# Patient Record
Sex: Male | Born: 1995 | Hispanic: Yes | Marital: Married | Smoking: Current every day smoker
Health system: Southern US, Community
[De-identification: ages and names within clinical notes are randomized; demographics above are authoritative.]

## PROBLEM LIST (undated history)

## (undated) DIAGNOSIS — Z789 Other specified health status: Secondary | ICD-10-CM

## (undated) DIAGNOSIS — F32A Depression, unspecified: Secondary | ICD-10-CM

## (undated) DIAGNOSIS — R519 Headache, unspecified: Secondary | ICD-10-CM

## (undated) DIAGNOSIS — K649 Unspecified hemorrhoids: Secondary | ICD-10-CM

## (undated) HISTORY — PX: OTHER SURGICAL HISTORY: SHX169

## (undated) HISTORY — PX: NO PAST SURGERIES: SHX2092

## (undated) HISTORY — DX: Other specified health status: Z78.9

---

## 2016-05-16 ENCOUNTER — Emergency Department (HOSPITAL_COMMUNITY)
Admission: EM | Admit: 2016-05-16 | Discharge: 2016-05-16 | Disposition: A | Discharge status: Home / Self Care | Payer: Self-pay | Attending: Emergency Medicine | Admitting: Emergency Medicine

## 2016-05-16 ENCOUNTER — Emergency Department (HOSPITAL_COMMUNITY): Payer: Self-pay

## 2016-05-16 DIAGNOSIS — X500XXA Overexertion from strenuous movement or load, initial encounter: Secondary | ICD-10-CM | POA: Insufficient documentation

## 2016-05-16 DIAGNOSIS — S29012A Strain of muscle and tendon of back wall of thorax, initial encounter: Secondary | ICD-10-CM | POA: Insufficient documentation

## 2016-05-16 DIAGNOSIS — T148XXA Other injury of unspecified body region, initial encounter: Secondary | ICD-10-CM

## 2016-05-16 DIAGNOSIS — Y999 Unspecified external cause status: Secondary | ICD-10-CM | POA: Insufficient documentation

## 2016-05-16 DIAGNOSIS — M5412 Radiculopathy, cervical region: Secondary | ICD-10-CM | POA: Insufficient documentation

## 2016-05-16 DIAGNOSIS — Y929 Unspecified place or not applicable: Secondary | ICD-10-CM | POA: Insufficient documentation

## 2016-05-16 DIAGNOSIS — R0789 Other chest pain: Secondary | ICD-10-CM | POA: Insufficient documentation

## 2016-05-16 DIAGNOSIS — Y939 Activity, unspecified: Secondary | ICD-10-CM | POA: Insufficient documentation

## 2016-05-16 MED ORDER — NAPROXEN 500 MG PO TABS: 500.0000 mg | ORAL_TABLET | Freq: Two times a day (BID) | ORAL | 0 refills | Status: DC

## 2016-05-16 MED ORDER — CYCLOBENZAPRINE HCL 10 MG PO TABS: 10.0000 mg | ORAL_TABLET | Freq: Two times a day (BID) | ORAL | 0 refills | Status: DC | PRN

## 2016-05-16 NOTE — ED Provider Notes (Signed)
[facility]-EMERGENCY DEPT Provider Note   CSN: [id number] Arrival date & time: 05/16/16  1191     History   Chief Complaint Chief Complaint  Patient presents with  . Chest Pain    HPI Brad [name] is a 21 y.o. male.  HPI Symptoms onset 2 days ago. Patient has pain along the top of his shoulder, upper chest in the area of the clavicle and left arm. He reports with certain activities, particularly leaning towards the left and lifting with the arm, he gets pins and needles sensation in the left arm. No loss of strength. He does however note the more he forcefully tries to do something the more he perceives the tingling sensation. No headache. No injury. No shortness of breath, no fever. He reports at work he typically lives about 30 pounds but not much heavier than that. He denies any recent illness. No past medical history on file. Patient is healthy without known past medical history. There are no active problems to display for this patient.   No past surgical history on file.     Home Medications    Prior to Admission medications   Medication Sig Start Date End Date Taking? Authorizing Provider  cyclobenzaprine (FLEXERIL) 10 MG tablet Take 1 tablet (10 mg total) by mouth 2 (two) times daily as needed for muscle spasms. 05/16/16   Arby Barrette, MD  naproxen (NAPROSYN) 500 MG tablet Take 1 tablet (500 mg total) by mouth 2 (two) times daily. 05/16/16   Arby Barrette, MD    Family History No family history on file. Mother has diabetes. Family members otherwise healthy. No sudden death or early coronary artery disease. Social History Social History  Substance Use Topics  . Smoking status: Not on file  . Smokeless tobacco: Not on file  . Alcohol use Not on file     Allergies   Patient has no known allergies.   Review of Systems Review of Systems 10 Systems reviewed and are negative for acute change except as noted in the HPI.   Physical Exam Updated Vital  Signs BP 132/81   Pulse 80   Temp 98.4 F (36.9 C) (Oral)   Resp 19   SpO2 100%   Physical Exam  Constitutional: He is oriented to person, place, and time. He appears well-developed and well-nourished.  Awake, alert, nontoxic appearance with baseline speech for patient.  HENT:  Head: Normocephalic and atraumatic.  Nose: Nose normal.  Mouth/Throat: Oropharynx is clear and moist. No oropharyngeal exudate.  Eyes: EOM are normal. Pupils are equal, round, and reactive to light. Right eye exhibits no discharge. Left eye exhibits no discharge.  Neck: Neck supple.  Pain to palpation left paraspinous cervical muscle bodies and trapezius along top of shoulder very tender.  Cardiovascular: Normal rate, regular rhythm, normal heart sounds and intact distal pulses.   No murmur heard. Pulmonary/Chest: Effort normal and breath sounds normal. No stridor. No respiratory distress. He has no wheezes. He has no rales. He exhibits tenderness.  Tender to palpation upper chest along the distal clavicle and anterior shoulder.  Abdominal: Soft. Bowel sounds are normal. He exhibits no mass. There is no tenderness. There is no rebound.  Musculoskeletal: Normal range of motion. He exhibits no edema or tenderness.  Baseline ROM, moves extremities with no obvious new focal weakness.  Lymphadenopathy:    He has no cervical adenopathy.  Neurological: He is alert and oriented to person, place, and time. No cranial nerve deficit. He exhibits normal muscle  tone. Coordination normal.  Awake, alert, cooperative and aware of situation; motor strength bilaterally; sensation normal to light touch bilaterally; gait is normal..  Skin: Skin is warm and dry. No rash noted.  Psychiatric: He has a normal mood and affect.  Nursing note and vitals reviewed.    ED Treatments / Results  Labs (all labs ordered are listed, but only abnormal results are displayed) Labs Reviewed - No data to display  EKG  EKG  Interpretation  Date/Time:  Monday May 16 2016 08:48:25 EDT Ventricular Rate:  71 PR Interval:  130 QRS Duration: 86 QT Interval:  338 QTC Calculation: 367 R Axis:   87 Text Interpretation:  Normal sinus rhythm early repolarization. normal. no old comparison. Confirmed by Donnald Garre, MD, Lebron Conners [phone]) on 05/16/2016 10:36:47 AM       Radiology Dg Chest 2 View  Result Date: 05/16/2016 CLINICAL DATA:  21 year old male with chest pain the last few days. Smoker. Initial encounter. EXAM: CHEST  2 VIEW COMPARISON:  None. FINDINGS: The heart size and mediastinal contours are within normal limits. Both lungs are clear. The visualized skeletal structures are unremarkable. IMPRESSION: No active cardiopulmonary disease. Electronically Signed   By: Lacy Duverney M.D.   On: 05/16/2016 09:06    Procedures Procedures (including critical care time)  Medications Ordered in ED Medications - No data to display   Initial Impression / Assessment and Plan / ED Course  I have reviewed the triage vital signs and the nursing notes.  Pertinent labs & imaging results that were available during my care of the patient were reviewed by me and considered in my medical decision making (see chart for details).      Final Clinical Impressions(s) / ED Diagnoses   Final diagnoses:  Radiculopathy of cervical region  Muscle strain  Chest wall pain   Symptoms are suggestive of radicular pain to the arm. No associated weakness. Positive for associated reproducible paracervical and trapezius pain to palpation. Patient with no risk factors for coronary artery disease, dissection, stroke, PE. He is clinically well and physically well condition with normal vital signs. Will treat with anti-inflammatory and muscle relaxer. New Prescriptions New Prescriptions   CYCLOBENZAPRINE (FLEXERIL) 10 MG TABLET    Take 1 tablet (10 mg total) by mouth 2 (two) times daily as needed for muscle spasms.   NAPROXEN (NAPROSYN) 500 MG  TABLET    Take 1 tablet (500 mg total) by mouth 2 (two) times daily.     Arby Barrette, MD 05/16/16 1040

## 2016-05-16 NOTE — ED Triage Notes (Signed)
Pt arrives ambulatory to ED for left sided chest pain since saturday. Pt states he has pain in his left chest and left arm and also has numbness in left arm.

## 2016-05-16 NOTE — ED Notes (Signed)
Pt given water to drink per Kirt Boys, RN

## 2016-05-19 ENCOUNTER — Emergency Department (HOSPITAL_COMMUNITY)
Admission: EM | Admit: 2016-05-19 | Discharge: 2016-05-19 | Disposition: A | Discharge status: Home / Self Care | Payer: Self-pay

## 2017-02-24 ENCOUNTER — Encounter (HOSPITAL_COMMUNITY): Payer: Self-pay | Admitting: Emergency Medicine

## 2017-02-24 ENCOUNTER — Other Ambulatory Visit: Payer: Self-pay

## 2017-02-24 DIAGNOSIS — H538 Other visual disturbances: Secondary | ICD-10-CM | POA: Insufficient documentation

## 2017-02-24 DIAGNOSIS — R51 Headache: Secondary | ICD-10-CM | POA: Insufficient documentation

## 2017-02-24 DIAGNOSIS — Z79899 Other long term (current) drug therapy: Secondary | ICD-10-CM | POA: Insufficient documentation

## 2017-02-24 DIAGNOSIS — F17210 Nicotine dependence, cigarettes, uncomplicated: Secondary | ICD-10-CM | POA: Insufficient documentation

## 2017-02-24 NOTE — ED Triage Notes (Signed)
Pt reports headache and pain to back of neck that he first had 3 days ago while having intercourse.  Pt had pain again tonight during intercourse.  Denies nausea and vomiting.  Dr. Patria Maneampos notified of pt and orders received.

## 2017-02-25 ENCOUNTER — Emergency Department (HOSPITAL_COMMUNITY): Payer: Self-pay

## 2017-02-25 ENCOUNTER — Emergency Department (HOSPITAL_COMMUNITY)
Admission: EM | Admit: 2017-02-25 | Discharge: 2017-02-25 | Disposition: A | Discharge status: Home / Self Care | Payer: Self-pay | Attending: Emergency Medicine | Admitting: Emergency Medicine

## 2017-02-25 DIAGNOSIS — R519 Headache, unspecified: Secondary | ICD-10-CM

## 2017-02-25 DIAGNOSIS — R51 Headache: Secondary | ICD-10-CM

## 2017-02-25 LAB — COMPREHENSIVE METABOLIC PANEL
ALT: 41 U/L (ref 17–63)
ANION GAP: 10 (ref 5–15)
AST: 24 U/L (ref 15–41)
Albumin: 4.1 g/dL (ref 3.5–5.0)
Alkaline Phosphatase: 72 U/L (ref 38–126)
BUN: 14 mg/dL (ref 6–20)
CHLORIDE: 104 mmol/L (ref 101–111)
CO2: 26 mmol/L (ref 22–32)
Calcium: 9.4 mg/dL (ref 8.9–10.3)
Creatinine, Ser: 0.8 mg/dL (ref 0.61–1.24)
GFR calc non Af Amer: >60 mL/min (ref >60)
Glucose, Bld: 107 mg/dL — ABNORMAL HIGH (ref 65–99)
Potassium: 4.2 mmol/L (ref 3.5–5.1)
SODIUM: 140 mmol/L (ref 135–145)
Total Bilirubin: 0.6 mg/dL (ref 0.3–1.2)
Total Protein: 7.3 g/dL (ref 6.5–8.1)

## 2017-02-25 LAB — CBC WITH DIFFERENTIAL/PLATELET
BASOS PCT: 1 %
Basophils Absolute: 0.1 10*3/uL (ref 0.0–0.1)
EOS ABS: 0.1 10*3/uL (ref 0.0–0.7)
EOS PCT: 2 %
HCT: 45.9 % (ref 39.0–52.0)
Hemoglobin: 15.6 g/dL (ref 13.0–17.0)
LYMPHS ABS: 2.7 10*3/uL (ref 0.7–4.0)
Lymphocytes Relative: 34 %
MCH: 31.1 pg (ref 26.0–34.0)
MCHC: 34 g/dL (ref 30.0–36.0)
MCV: 91.6 fL (ref 78.0–100.0)
MONOS PCT: 9 %
Monocytes Absolute: 0.7 10*3/uL (ref 0.1–1.0)
Neutro Abs: 4.3 10*3/uL (ref 1.7–7.7)
Neutrophils Relative %: 54 %
PLATELETS: 294 10*3/uL (ref 150–400)
RBC: 5.01 MIL/uL (ref 4.22–5.81)
RDW: 12.6 % (ref 11.5–15.5)
WBC: 7.9 10*3/uL (ref 4.0–10.5)

## 2017-02-25 MED ORDER — METOCLOPRAMIDE HCL 10 MG PO TABS: 10.0000 mg | ORAL_TABLET | Freq: Four times a day (QID) | ORAL | 0 refills | Status: DC | PRN

## 2017-02-25 MED ORDER — DIPHENHYDRAMINE HCL 50 MG/ML IJ SOLN
25.0000 mg | Freq: Once | INTRAMUSCULAR | Status: AC
  Administered 2017-02-25: 25 mg via INTRAVENOUS
  Filled 2017-02-25: qty 1

## 2017-02-25 MED ORDER — KETOROLAC TROMETHAMINE 30 MG/ML IJ SOLN
30.0000 mg | Freq: Once | INTRAMUSCULAR | Status: AC
  Administered 2017-02-25: 30 mg via INTRAVENOUS
  Filled 2017-02-25: qty 1

## 2017-02-25 MED ORDER — SODIUM CHLORIDE 0.9 % IV BOLUS (SEPSIS)
1000.0000 mL | Freq: Once | INTRAVENOUS | Status: AC
  Administered 2017-02-25: 1000 mL via INTRAVENOUS

## 2017-02-25 MED ORDER — PROCHLORPERAZINE EDISYLATE 5 MG/ML IJ SOLN
10.0000 mg | Freq: Once | INTRAMUSCULAR | Status: AC
  Administered 2017-02-25: 10 mg via INTRAVENOUS
  Filled 2017-02-25: qty 2

## 2017-02-25 MED ORDER — DEXAMETHASONE SODIUM PHOSPHATE 10 MG/ML IJ SOLN
10.0000 mg | Freq: Once | INTRAMUSCULAR | Status: AC
  Administered 2017-02-25: 10 mg via INTRAVENOUS
  Filled 2017-02-25: qty 1

## 2017-02-25 MED ORDER — BUTALBITAL-APAP-CAFFEINE 50-325-40 MG PO TABS: 1.0000 | ORAL_TABLET | Freq: Four times a day (QID) | ORAL | 0 refills | Status: DC | PRN

## 2017-02-25 NOTE — Discharge Instructions (Signed)
Do not hesitate to return to the emergency room for any new, worsening or concerning symptoms. ° °Please obtain primary care using resource guide below. Let them know that you were seen in the emergency room and that they will need to obtain records for further outpatient management. ° ° °

## 2017-02-25 NOTE — ED Notes (Signed)
ED Provider at bedside. 

## 2017-02-25 NOTE — ED Provider Notes (Signed)
[name] Provider Note   CSN: [id number] Arrival date & time: 02/24/17  2310     History   Chief Complaint Chief Complaint  Patient presents with  . Headache     HPI   Blood pressure 120/77, pulse 70, temperature 98 F (36.7 C), resp. rate 16, height 5\' 5"  (1.651 m), weight 74.8 kg (165 lb), SpO2 99 %.  Brad [name] is a 22 y.o. male complaining of basilar headache onset 3 days ago during intercourse.  He states that the pain was so severe they had to stop having sex.  He has been having it off and on over the course of the last several days and then he started having an 10 PM and it was again very severe, again he had to stop.  He is tried multiple over-the-counter pain medications.  He is light sensitive with no or, nausea or vomiting.  He rates his pain at 8 out of 10.  No neck stiffness, fever chills, sore throat, ataxia, chest pain, shortness of breath.  He endorses blurred vision on review of systems.  Reports that he has had bone pain since he was 22 years old.  He denies any easy bruising, bleeding, unintentional weight loss but he endorses night sweats.  History reviewed. No pertinent past medical history.  There are no active problems to display for this patient.   History reviewed. No pertinent surgical history.     Home Medications    Prior to Admission medications   Medication Sig Start Date End Date Taking? Authorizing Provider  butalbital-acetaminophen-caffeine (FIORICET, ESGIC) [phone] MG tablet Take 1 tablet by mouth every 6 (six) hours as needed for headache. 02/25/17   Lyric Rossano, Joni ReiningNicole, PA-C  cyclobenzaprine (FLEXERIL) 10 MG tablet Take 1 tablet (10 mg total) by mouth 2 (two) times daily as needed for muscle spasms. Patient not taking: Reported on 02/25/2017 05/16/16   Arby BarrettePfeiffer, Marcy, MD  metoCLOPramide (REGLAN) 10 MG tablet Take 1 tablet (10 mg total) by mouth every 6 (six) hours as needed for nausea  (nausea/headache). 02/25/17   Brandin Stetzer, Joni ReiningNicole, PA-C  naproxen (NAPROSYN) 500 MG tablet Take 1 tablet (500 mg total) by mouth 2 (two) times daily. Patient not taking: Reported on 02/25/2017 05/16/16   Arby BarrettePfeiffer, Marcy, MD    Family History No family history on file.  Social History Social History   Tobacco Use  . Smoking status: Current Every Day Smoker  . Smokeless tobacco: Never Used  Substance Use Topics  . Alcohol use: Yes  . Drug use: Yes    Types: Marijuana     Allergies   Patient has no known allergies.   Review of Systems Review of Systems  A complete review of systems was obtained and all systems are negative except as noted in the HPI and PMH.   Physical Exam Updated Vital Signs BP 112/78   Pulse 93   Temp 98 F (36.7 C)   Resp 16   Ht 5\' 5"  (1.651 m)   Wt 74.8 kg (165 lb)   SpO2 100%   BMI 27.46 kg/m   Physical Exam  Constitutional: He is oriented to person, place, and time. He appears well-developed and well-nourished. No distress.  HENT:  Head: Normocephalic and atraumatic.  Mouth/Throat: Oropharynx is clear and moist.  Eyes: Conjunctivae and EOM are normal. Pupils are equal, round, and reactive to light.  No TTP of maxillary or frontal sinuses  No TTP or induration of temporal arteries bilaterally  Neck: Normal range of motion. Neck supple.  FROM to C-spine. Pt can touch chin to chest without discomfort. No TTP of midline cervical spine.   Cardiovascular: Normal rate, regular rhythm and intact distal pulses.  Pulmonary/Chest: Effort normal and breath sounds normal. No respiratory distress. He has no wheezes. He has no rales. He exhibits no tenderness.  Abdominal: Soft. Bowel sounds are normal. There is no tenderness.  Musculoskeletal: Normal range of motion. He exhibits no edema or tenderness.  Neurological: He is alert and oriented to person, place, and time. No cranial nerve deficit. He displays no Babinski's sign (.npmdm) on the left side.    II-Visual fields grossly intact. III/IV/VI-Extraocular movements intact.  Pupils reactive bilaterally. V/VII-Smile symmetric, equal eyebrow raise,  facial sensation intact VIII- Hearing grossly intact IX/X-Normal gag XI-bilateral shoulder shrug XII-midline tongue extension Motor: 5/5 bilaterally with normal tone and bulk Cerebellar: Normal finger-to-nose  and normal heel-to-shin test.   Romberg negative Ambulates with a coordinated gait   Skin: He is not diaphoretic.  Psychiatric: He has a normal mood and affect.  Nursing note and vitals reviewed.    ED Treatments / Results  Labs (all labs ordered are listed, but only abnormal results are displayed) Labs Reviewed  COMPREHENSIVE METABOLIC PANEL - Abnormal; Notable for the following components:      Result Value   Glucose, Bld 107 (*)    All other components within normal limits  CBC WITH DIFFERENTIAL/PLATELET    EKG  EKG Interpretation None       Radiology Ct Head Wo Contrast  Result Date: 02/25/2017 CLINICAL DATA:  Headache and pain to the back of the head beginning 3 days ago while having intercourse. Pain again tonight. Bump on the right side of the forehead. EXAM: CT HEAD WITHOUT CONTRAST TECHNIQUE: Contiguous axial images were obtained from the base of the skull through the vertex without intravenous contrast. COMPARISON:  None. FINDINGS: Brain: No evidence of acute infarction, hemorrhage, hydrocephalus, extra-axial collection or mass lesion/mass effect. Vascular: No hyperdense vessel or unexpected calcification. Skull: Normal. Negative for fracture or focal lesion. Sinuses/Orbits: Diffuse mucosal thickening throughout the paranasal sinuses. No acute air-fluid levels. Mastoid air cells are not opacified. Other: None. IMPRESSION: No acute intracranial abnormalities. Chronic inflammatory changes in the paranasal sinuses. Electronically Signed   By: Burman Nieves M.D.   On: 02/25/2017 00:51    Procedures Procedures  (including critical care time)  Medications Ordered in ED Medications  sodium chloride 0.9 % bolus 1,000 mL (0 mLs Intravenous Stopped 02/25/17 0759)  ketorolac (TORADOL) 30 MG/ML injection 30 mg (30 mg Intravenous Given 02/25/17 0715)  prochlorperazine (COMPAZINE) injection 10 mg (10 mg Intravenous Given 02/25/17 0717)  dexamethasone (DECADRON) injection 10 mg (10 mg Intravenous Given 02/25/17 0718)  diphenhydrAMINE (BENADRYL) injection 25 mg (25 mg Intravenous Given 02/25/17 0719)     Initial Impression / Assessment and Plan / ED Course  I have reviewed the triage vital signs and the nursing notes.  Pertinent labs & imaging results that were available during my care of the patient were reviewed by me and considered in my medical decision making (see chart for details).     Vitals:   02/24/17 2338 02/25/17 0231 02/25/17 0700 02/25/17 0730  BP: 137/63 120/77 118/72 112/78  Pulse: 95 70 78 93  Resp: 18 16    Temp: 98 F (36.7 C)     SpO2: 100% 99% 100% 100%  Weight: 74.8 kg (165 lb)     Height: 5\' 5"  (  1.651 m)       Medications  sodium chloride 0.9 % bolus 1,000 mL (0 mLs Intravenous Stopped 02/25/17 0759)  ketorolac (TORADOL) 30 MG/ML injection 30 mg (30 mg Intravenous Given 02/25/17 0715)  prochlorperazine (COMPAZINE) injection 10 mg (10 mg Intravenous Given 02/25/17 0717)  dexamethasone (DECADRON) injection 10 mg (10 mg Intravenous Given 02/25/17 0718)  diphenhydrAMINE (BENADRYL) injection 25 mg (25 mg Intravenous Given 02/25/17 0719)    Wally Shevchenko Marques is 22 y.o. male headache exacerbation during intercourse.  He has a nonfocal neurologic exam, head CT negative.  Discussed with attending physician who does not recommend any further emergent intervention at this point, patient given headache cocktail with complete resolution of his symptoms, he will be given a referral to outpatient primary care, offered work note however patient declines he states that he works in  [employer] and they would not on her a work note regardless.  Return precautions and patient verbalized understanding and teach back technique.  Evaluation does not show pathology that would require ongoing emergent intervention or inpatient treatment. Pt is hemodynamically stable and mentating appropriately. Discussed findings and plan with patient/guardian, who agrees with care plan. All questions answered. Return precautions discussed and outpatient follow up given.      Final Clinical Impressions(s) / ED Diagnoses   Final diagnoses:  Nonintractable headache, unspecified chronicity pattern, unspecified headache type    ED Discharge Orders        Ordered    butalbital-acetaminophen-caffeine (FIORICET, ESGIC) 50-325-40 MG tablet  Every 6 hours PRN     02/25/17 0828    metoCLOPramide (REGLAN) 10 MG tablet  Every 6 hours PRN     02/25/17 0828       Zuley Lutter, Mardella Layman 02/25/17 1610    Azalia Bilis, MD 02/26/17 [phone]

## 2017-06-30 ENCOUNTER — Encounter (HOSPITAL_COMMUNITY): Payer: Self-pay

## 2017-06-30 ENCOUNTER — Other Ambulatory Visit: Payer: Self-pay

## 2017-06-30 ENCOUNTER — Emergency Department (HOSPITAL_COMMUNITY)
Admission: EM | Admit: 2017-06-30 | Discharge: 2017-06-30 | Disposition: A | Discharge status: Home / Self Care | Payer: Self-pay | Attending: Emergency Medicine | Admitting: Emergency Medicine

## 2017-06-30 DIAGNOSIS — K436 Other and unspecified ventral hernia with obstruction, without gangrene: Secondary | ICD-10-CM | POA: Insufficient documentation

## 2017-06-30 DIAGNOSIS — K439 Ventral hernia without obstruction or gangrene: Secondary | ICD-10-CM

## 2017-06-30 DIAGNOSIS — K5900 Constipation, unspecified: Secondary | ICD-10-CM | POA: Insufficient documentation

## 2017-06-30 DIAGNOSIS — F17210 Nicotine dependence, cigarettes, uncomplicated: Secondary | ICD-10-CM | POA: Insufficient documentation

## 2017-06-30 LAB — URINALYSIS, ROUTINE W REFLEX MICROSCOPIC
BILIRUBIN URINE: NEGATIVE
Glucose, UA: NEGATIVE mg/dL
Hgb urine dipstick: NEGATIVE
KETONES UR: NEGATIVE mg/dL
Leukocytes, UA: NEGATIVE
NITRITE: NEGATIVE
PROTEIN: NEGATIVE mg/dL
Specific Gravity, Urine: 1.004 — ABNORMAL LOW (ref 1.005–1.030)
pH: 6 (ref 5.0–8.0)

## 2017-06-30 LAB — CBC
HEMATOCRIT: 46.9 % (ref 39.0–52.0)
Hemoglobin: 16.3 g/dL (ref 13.0–17.0)
MCH: 31.7 pg (ref 26.0–34.0)
MCHC: 34.8 g/dL (ref 30.0–36.0)
MCV: 91.2 fL (ref 78.0–100.0)
PLATELETS: 233 10*3/uL (ref 150–400)
RBC: 5.14 MIL/uL (ref 4.22–5.81)
RDW: 12.6 % (ref 11.5–15.5)
WBC: 7.7 10*3/uL (ref 4.0–10.5)

## 2017-06-30 LAB — COMPREHENSIVE METABOLIC PANEL
ALBUMIN: 5.2 g/dL — AB (ref 3.5–5.0)
ALK PHOS: 70 U/L (ref 38–126)
ALT: 28 U/L (ref 17–63)
AST: 19 U/L (ref 15–41)
Anion gap: 9 (ref 5–15)
BUN: 12 mg/dL (ref 6–20)
CALCIUM: 9.4 mg/dL (ref 8.9–10.3)
CO2: 26 mmol/L (ref 22–32)
CREATININE: 0.81 mg/dL (ref 0.61–1.24)
Chloride: 106 mmol/L (ref 101–111)
GFR calc Af Amer: >60 mL/min (ref >60)
GLUCOSE: 94 mg/dL (ref 65–99)
POTASSIUM: 4 mmol/L (ref 3.5–5.1)
Sodium: 141 mmol/L (ref 135–145)
TOTAL PROTEIN: 7.9 g/dL (ref 6.5–8.1)
Total Bilirubin: 0.6 mg/dL (ref 0.3–1.2)

## 2017-06-30 LAB — LIPASE, BLOOD: Lipase: 33 U/L (ref 11–51)

## 2017-06-30 MED ORDER — ONDANSETRON 4 MG PO TBDP: 4.0000 mg | ORAL_TABLET | Freq: Three times a day (TID) | ORAL | 0 refills | Status: DC | PRN

## 2017-06-30 MED ORDER — DOCUSATE SODIUM 100 MG PO CAPS: 100.0000 mg | ORAL_CAPSULE | Freq: Two times a day (BID) | ORAL | 0 refills | Status: DC

## 2017-06-30 NOTE — Discharge Instructions (Addendum)
Colace medication to help prevent constipation and straining. Call Dr. Luisa Hart (Surgeon) to make outpatient appointment to discuss hernia repair

## 2017-06-30 NOTE — ED Triage Notes (Addendum)
Patient reports that he has issues with constipation and abdominal pain x 1 month. Patient states he had a normal BM today,but other days he has to take stool sofetners and states it doesn't really work. Patient also reports that he has blurred vision and dizziness when he wakes int he AM. Patient c/o raised area to the let upper abdomen x 2 months and when he walks and touches the area it is like 6/10 pain

## 2017-06-30 NOTE — ED Provider Notes (Signed)
[facility]-EMERGENCY DEPT Provider Note   CSN: [id number] Arrival date & time: 06/30/17  1056     History   Chief Complaint Chief Complaint  Patient presents with  . Abdominal Pain  . Blurred Vision  . Constipation  . raised area to the left upper abdomen    HPI Brad [name] is a 22 y.o. male.  Chief complaint is abdominal pain, constipation  HPI 22 year old male.  Has a history of intermittent constipation.  Intermittently and occasionally will have to strain at stool.  Had a bowel movement yesterday.  He has an area of his left lower abdomen he states occasional "bulge" when he has to strain and it is painful.  This self reduces however he became concerned and presents here.  Morning after straining a stool he felt a little lightheaded and nauseated.  This has resolved.  History reviewed. No pertinent past medical history.  There are no active problems to display for this patient.   History reviewed. No pertinent surgical history.      Home Medications    Prior to Admission medications   Medication Sig Start Date End Date Taking? Authorizing Provider  butalbital-acetaminophen-caffeine (FIORICET, ESGIC) 50-325-40 MG tablet Take 1 tablet by mouth every 6 (six) hours as needed for headache. Patient not taking: Reported on 06/30/2017 02/25/17   Pisciotta, Joni Reining, PA-C  docusate sodium (COLACE) 100 MG capsule Take 1 capsule (100 mg total) by mouth every 12 (twelve) hours. 06/30/17   Rolland Porter, MD  metoCLOPramide (REGLAN) 10 MG tablet Take 1 tablet (10 mg total) by mouth every 6 (six) hours as needed for nausea (nausea/headache). Patient not taking: Reported on 06/30/2017 02/25/17   Pisciotta, Joni Reining, PA-C  ondansetron (ZOFRAN ODT) 4 MG disintegrating tablet Take 1 tablet (4 mg total) by mouth every 8 (eight) hours as needed for nausea. 06/30/17   Rolland Porter, MD    Family History Family History  Problem Relation Age of Onset  . Diabetes  Mother     Social History Social History   Tobacco Use  . Smoking status: Current Every Day Smoker    Packs/day: 0.15    Types: Cigarettes  . Smokeless tobacco: Never Used  Substance Use Topics  . Alcohol use: Yes    Comment: occasionally  . Drug use: Yes    Types: Marijuana    Comment: daily     Allergies   Patient has no known allergies.   Review of Systems Review of Systems  Constitutional: Negative for appetite change, chills, diaphoresis, fatigue and fever.  HENT: Negative for mouth sores, sore throat and trouble swallowing.   Eyes: Negative for visual disturbance.  Respiratory: Negative for cough, chest tightness, shortness of breath and wheezing.   Cardiovascular: Negative for chest pain.  Gastrointestinal: Positive for abdominal pain and constipation. Negative for abdominal distention, diarrhea, nausea and vomiting.  Endocrine: Negative for polydipsia, polyphagia and polyuria.  Genitourinary: Negative for dysuria, frequency and hematuria.  Musculoskeletal: Negative for gait problem.  Skin: Negative for color change, pallor and rash.  Neurological: Negative for dizziness, syncope, light-headedness and headaches.  Hematological: Does not bruise/bleed easily.  Psychiatric/Behavioral: Negative for behavioral problems and confusion.     Physical Exam Updated Vital Signs BP 118/76 (BP Location: Right Arm)   Pulse 75   Temp 98.4 F (36.9 C) (Oral)   Resp 15   Ht  (1.651 m)   Wt 80.5 kg (177 lb 8 oz)   SpO2 100%   BMI 29.54  kg/m   Physical Exam  Constitutional: He is oriented to person, place, and time. He appears well-developed and well-nourished. No distress.  HENT:  Head: Normocephalic.  Eyes: Pupils are equal, round, and reactive to light. Conjunctivae are normal. No scleral icterus.  Neck: Normal range of motion. Neck supple. No thyromegaly present.  Cardiovascular: Normal rate and regular rhythm. Exam reveals no gallop and no friction rub.  No  murmur heard. Pulmonary/Chest: Effort normal and breath sounds normal. No respiratory distress. He has no wheezes. He has no rales.  Abdominal: Soft. Bowel sounds are normal. He exhibits no distension. There is no tenderness. There is no rebound.  Area slight tenderness and possible palpable defect in the left inferior mid lateral abdomen consistent with a possible spigelian hernia.  Normoactive bowel sounds.  Nondistended.  Musculoskeletal: Normal range of motion.  Neurological: He is alert and oriented to person, place, and time.  Skin: Skin is warm and dry. No rash noted.  Psychiatric: He has a normal mood and affect. His behavior is normal.     ED Treatments / Results  Labs (all labs ordered are listed, but only abnormal results are displayed) Labs Reviewed  COMPREHENSIVE METABOLIC PANEL - Abnormal; Notable for the following components:      Result Value   Albumin 5.2 (*)    All other components within normal limits  URINALYSIS, ROUTINE W REFLEX MICROSCOPIC - Abnormal; Notable for the following components:   Color, Urine STRAW (*)    Specific Gravity, Urine 1.004 (*)    All other components within normal limits  LIPASE, BLOOD  CBC    EKG None  Radiology No results found.  Procedures Procedures (including critical care time)  Medications Ordered in ED Medications - No data to display   Initial Impression / Assessment and Plan / ED Course  I have reviewed the triage vital signs and the nursing notes.  Pertinent labs & imaging results that were available during my care of the patient were reviewed by me and considered in my medical decision making (see chart for details).    Symptom medic currently.  Probable hernia that is exacerbated with straining at stool.  Plan increase fiber and fluids.  Colace.  He will follow-up regarding his possible hernia  Final Clinical Impressions(s) / ED Diagnoses   Final diagnoses:  Constipation, unspecified constipation type    Spigelian hernia    ED Discharge Orders        Ordered    ondansetron (ZOFRAN ODT) 4 MG disintegrating tablet  Every 8 hours PRN     06/30/17 1319    docusate sodium (COLACE) 100 MG capsule  Every 12 hours     06/30/17 1319       Rolland Porter, MD 06/30/17 1541

## 2017-07-04 ENCOUNTER — Other Ambulatory Visit: Payer: Self-pay

## 2017-07-04 ENCOUNTER — Encounter (HOSPITAL_COMMUNITY): Payer: Self-pay | Admitting: Emergency Medicine

## 2017-07-04 ENCOUNTER — Emergency Department (HOSPITAL_COMMUNITY)
Admission: EM | Admit: 2017-07-04 | Discharge: 2017-07-04 | Disposition: A | Discharge status: Home / Self Care | Payer: Self-pay | Attending: Emergency Medicine | Admitting: Emergency Medicine

## 2017-07-04 ENCOUNTER — Emergency Department (HOSPITAL_COMMUNITY): Payer: Self-pay

## 2017-07-04 DIAGNOSIS — F17210 Nicotine dependence, cigarettes, uncomplicated: Secondary | ICD-10-CM | POA: Insufficient documentation

## 2017-07-04 DIAGNOSIS — M5432 Sciatica, left side: Secondary | ICD-10-CM | POA: Insufficient documentation

## 2017-07-04 DIAGNOSIS — Z79899 Other long term (current) drug therapy: Secondary | ICD-10-CM | POA: Insufficient documentation

## 2017-07-04 DIAGNOSIS — K439 Ventral hernia without obstruction or gangrene: Secondary | ICD-10-CM

## 2017-07-04 DIAGNOSIS — K469 Unspecified abdominal hernia without obstruction or gangrene: Secondary | ICD-10-CM | POA: Insufficient documentation

## 2017-07-04 LAB — CBC
HEMATOCRIT: 46.1 % (ref 39.0–52.0)
Hemoglobin: 15.3 g/dL (ref 13.0–17.0)
MCH: 30.8 pg (ref 26.0–34.0)
MCHC: 33.2 g/dL (ref 30.0–36.0)
MCV: 92.8 fL (ref 78.0–100.0)
Platelets: 201 10*3/uL (ref 150–400)
RBC: 4.97 MIL/uL (ref 4.22–5.81)
RDW: 12.8 % (ref 11.5–15.5)
WBC: 7.4 10*3/uL (ref 4.0–10.5)

## 2017-07-04 LAB — LIPASE, BLOOD: Lipase: 26 U/L (ref 11–51)

## 2017-07-04 LAB — URINALYSIS, ROUTINE W REFLEX MICROSCOPIC
BILIRUBIN URINE: NEGATIVE
Glucose, UA: NEGATIVE mg/dL
Hgb urine dipstick: NEGATIVE
KETONES UR: NEGATIVE mg/dL
LEUKOCYTES UA: NEGATIVE
NITRITE: NEGATIVE
PH: 7 (ref 5.0–8.0)
PROTEIN: NEGATIVE mg/dL
Specific Gravity, Urine: 1.025 (ref 1.005–1.030)

## 2017-07-04 LAB — COMPREHENSIVE METABOLIC PANEL
ALBUMIN: 4.4 g/dL (ref 3.5–5.0)
ALT: 29 U/L (ref 17–63)
ANION GAP: 7 (ref 5–15)
AST: 18 U/L (ref 15–41)
Alkaline Phosphatase: 63 U/L (ref 38–126)
BILIRUBIN TOTAL: 0.6 mg/dL (ref 0.3–1.2)
BUN: 13 mg/dL (ref 6–20)
CALCIUM: 9.3 mg/dL (ref 8.9–10.3)
CO2: 28 mmol/L (ref 22–32)
Chloride: 106 mmol/L (ref 101–111)
Creatinine, Ser: 0.77 mg/dL (ref 0.61–1.24)
GFR calc Af Amer: >60 mL/min (ref >60)
GLUCOSE: 102 mg/dL — AB (ref 65–99)
POTASSIUM: 4.5 mmol/L (ref 3.5–5.1)
Sodium: 141 mmol/L (ref 135–145)
TOTAL PROTEIN: 7.2 g/dL (ref 6.5–8.1)

## 2017-07-04 MED ORDER — IOPAMIDOL (ISOVUE-300) INJECTION 61%
100.0000 mL | Freq: Once | INTRAVENOUS | Status: AC | PRN
  Administered 2017-07-04: 100 mL via INTRAVENOUS

## 2017-07-04 MED ORDER — CYCLOBENZAPRINE HCL 10 MG PO TABS: 5.0000 mg | ORAL_TABLET | Freq: Two times a day (BID) | ORAL | 0 refills | Status: DC | PRN

## 2017-07-04 MED ORDER — MELOXICAM 15 MG PO TABS: 15.0000 mg | ORAL_TABLET | Freq: Every day | ORAL | 0 refills | Status: DC

## 2017-07-04 MED ORDER — DEXAMETHASONE SODIUM PHOSPHATE 10 MG/ML IJ SOLN
10.0000 mg | Freq: Once | INTRAMUSCULAR | Status: AC
  Administered 2017-07-04: 10 mg via INTRAVENOUS
  Filled 2017-07-04: qty 1

## 2017-07-04 MED ORDER — TRAMADOL HCL 50 MG PO TABS: 50.0000 mg | ORAL_TABLET | Freq: Four times a day (QID) | ORAL | 0 refills | Status: DC | PRN

## 2017-07-04 MED ORDER — IOPAMIDOL (ISOVUE-300) INJECTION 61%
INTRAVENOUS | Status: AC
  Filled 2017-07-04: qty 100

## 2017-07-04 MED ORDER — KETOROLAC TROMETHAMINE 30 MG/ML IJ SOLN
30.0000 mg | Freq: Once | INTRAMUSCULAR | Status: AC
  Administered 2017-07-04: 30 mg via INTRAVENOUS
  Filled 2017-07-04: qty 1

## 2017-07-04 MED ORDER — PREDNISONE 10 MG (21) PO TBPK: ORAL_TABLET | ORAL | 0 refills | Status: DC

## 2017-07-04 NOTE — ED Triage Notes (Signed)
Pt complaint of worsening left groin pain throughout the night; recently diagnosed with hernia.

## 2017-07-04 NOTE — Discharge Instructions (Addendum)
SEEK IMMEDIATE MEDICAL ATTENTION IF: New numbness, tingling, weakness, or problem with the use of your arms or legs.  Severe back pain not relieved with medications.  Change in bowel or bladder control.  Increasing pain in any areas of the body (such as chest or abdominal pain).  Shortness of breath, dizziness or fainting.  Nausea (feeling sick to your stomach), vomiting, fever, or sweats.  

## 2017-07-04 NOTE — ED Notes (Signed)
Refused bladder scan 

## 2017-07-04 NOTE — ED Provider Notes (Signed)
[facility]-EMERGENCY DEPT Provider Note   CSN: [id number] Arrival date & time: 07/04/17  0749     History   Chief Complaint Chief Complaint  Patient presents with  . Groin Pain    HPI Brad [name] is a 22 y.o. male who presents the emergency department for evaluation of hernia and leg pain.  Patient was seen on 06/30/2017 by Dr. Rolland Porter in the emergency department diagnosed with constipation and spigelian hernia.  No imaging done at that time.  Patient states that this morning he awoke with severe pain in his hernia and in his left hip radiating down the back of the leg.  He was unable to ambulate on the leg.  He denies any known injuries to his back, saddle anesthesia, incontinence of bowel or bladder or weakness of the lower extremities.  The patient denies any firm bulge in the lower abdomen and states that his abdomen is not tender at this time unless palpated.  The patient states that the pain in his hip and leg began this past Sunday.  He had no known injuries.  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  HPI  History reviewed. No pertinent past medical history.  There are no active problems to display for this patient.   History reviewed. No pertinent surgical history.      Home Medications    Prior to Admission medications   Medication Sig Start Date End Date Taking? Authorizing Provider  docusate sodium (COLACE) 100 MG capsule Take 1 capsule (100 mg total) by mouth every 12 (twelve) hours. 06/30/17  Yes Rolland Porter, MD  butalbital-acetaminophen-caffeine (FIORICET, ESGIC) [phone] MG tablet Take 1 tablet by mouth every 6 (six) hours as needed for headache. Patient not taking: Reported on 06/30/2017 02/25/17   Pisciotta, Joni Reining, PA-C  metoCLOPramide (REGLAN) 10 MG tablet Take 1 tablet (10 mg total) by mouth every 6 (six) hours as needed for nausea (nausea/headache). Patient not taking:  Reported on 06/30/2017 02/25/17   Pisciotta, Joni Reining, PA-C  ondansetron (ZOFRAN ODT) 4 MG disintegrating tablet Take 1 tablet (4 mg total) by mouth every 8 (eight) hours as needed for nausea. 06/30/17   Rolland Porter, MD    Family History Family History  Problem Relation Age of Onset  . Diabetes Mother     Social History Social History   Tobacco Use  . Smoking status: Current Every Day Smoker    Packs/day: 0.15    Types: Cigarettes  . Smokeless tobacco: Never Used  Substance Use Topics  . Alcohol use: Yes    Comment: occasionally  . Drug use: Yes    Types: Marijuana    Comment: daily     Allergies   Patient has no known allergies.   Review of Systems Review of Systems Ten systems reviewed and are negative for acute change, except as noted in the HPI.   Physical Exam Updated Vital Signs BP 120/69 (BP Location: Left Arm)   Pulse 72   Temp 97.8 F (36.6 C) (Oral)   Resp 16   SpO2 100%   Physical Exam  Constitutional: He is oriented to person, place, and time. He appears well-developed and well-nourished. No distress.  HENT:  Head: Normocephalic and atraumatic.  Eyes: Conjunctivae are normal. No scleral icterus.  Neck: Normal range of motion. Neck supple.  Cardiovascular: Normal rate, regular rhythm and normal heart sounds.  Pulmonary/Chest: Effort normal and breath sounds normal. No respiratory  distress.  Abdominal: Soft. Normal appearance. There is no tenderness.    Tender to palpation in the left lower quadrant of the abdomen, no palpable hernia felt on examination at this time.  Musculoskeletal: He exhibits no edema.  No lumbar tenderness.  Negative straight leg exam.  Subjective pain in the distribution of the left sciatic nerve on exam.  Normal DTRs bilaterally, normal sensation and pulses.  Neurological: He is alert and oriented to person, place, and time.  Skin: Skin is warm and dry. He is not diaphoretic.  Psychiatric: His behavior is normal.  Nursing note  and vitals reviewed.     ED Treatments / Results  Labs (all labs ordered are listed, but only abnormal results are displayed) Labs Reviewed  COMPREHENSIVE METABOLIC PANEL - Abnormal; Notable for the following components:      Result Value   Glucose, Bld 102 (*)    All other components within normal limits  LIPASE, BLOOD  CBC  URINALYSIS, ROUTINE W REFLEX MICROSCOPIC    EKG None  Radiology No results found.  Procedures Procedures (including critical care time)  Medications Ordered in ED Medications  ketorolac (TORADOL) 30 MG/ML injection 30 mg (has no administration in time range)  dexamethasone (DECADRON) injection 10 mg (has no administration in time range)     Initial Impression / Assessment and Plan / ED Course  I have reviewed the triage vital signs and the nursing notes.  Pertinent labs & imaging results that were available during my care of the patient were reviewed by me and considered in my medical decision making (see chart for details).     Patient with no evidence of acute abnormality on CT scan.  I have discussed findings with the patient, discussed lab values and diagnoses.  Patient advised to use abdominal tries.  Given note for no heavy lifting for the next 14 days.  Patient treated for sciatica here and will be discharged with a prednisone taper anti-inflammatories tramadol for severe pain.  I have discussed the expected course and patient appears appropriate for discharge.  [facility] June 13  Final Clinical Impressions(s) / ED Diagnoses   Final diagnoses:  Sciatica of left side  Hernia of abdominal wall    ED Discharge Orders    None       Arthor CaptainHarris, Wynnie Pacetti, PA-C 07/04/17 1633    Azalia Bilisampos, Kevin, MD 07/05/17 1313

## 2017-07-11 ENCOUNTER — Other Ambulatory Visit: Payer: Self-pay

## 2017-07-11 ENCOUNTER — Emergency Department (HOSPITAL_COMMUNITY)
Admission: EM | Admit: 2017-07-11 | Discharge: 2017-07-11 | Disposition: A | Discharge status: Home / Self Care | Payer: Self-pay | Attending: Emergency Medicine | Admitting: Emergency Medicine

## 2017-07-11 ENCOUNTER — Encounter (HOSPITAL_COMMUNITY): Payer: Self-pay | Admitting: Emergency Medicine

## 2017-07-11 DIAGNOSIS — Z5321 Procedure and treatment not carried out due to patient leaving prior to being seen by health care provider: Secondary | ICD-10-CM | POA: Insufficient documentation

## 2017-07-11 DIAGNOSIS — R109 Unspecified abdominal pain: Secondary | ICD-10-CM | POA: Insufficient documentation

## 2017-07-11 NOTE — ED Notes (Signed)
Patient called third time, no answer.  Lobby staff looked throughout waiting room and do not see patient.

## 2017-07-11 NOTE — ED Triage Notes (Signed)
Pt. Brad [name]/wife stated, I have a hernia and its started hurting real bad this morning

## 2017-07-11 NOTE — ED Triage Notes (Signed)
Patient not answering for recheck of vital signs. Not seen in lobby at this time.

## 2017-07-11 NOTE — ED Notes (Addendum)
Called pt to recheck vitals. No response. Do not see pt in lobby.

## 2018-02-14 ENCOUNTER — Emergency Department (HOSPITAL_COMMUNITY): Admission: EM | Admit: 2018-02-14 | Discharge: 2018-02-14 | Discharge status: Against Medical Advice | Payer: Self-pay

## 2018-02-14 NOTE — ED Triage Notes (Signed)
No response when called to triage. 

## 2018-02-14 NOTE — ED Notes (Signed)
No response when called to triage. 

## 2018-07-12 ENCOUNTER — Encounter (HOSPITAL_COMMUNITY): Payer: Self-pay

## 2018-07-12 ENCOUNTER — Emergency Department (HOSPITAL_COMMUNITY)
Admission: EM | Admit: 2018-07-12 | Discharge: 2018-07-12 | Disposition: A | Discharge status: Home / Self Care | Payer: Self-pay | Attending: Emergency Medicine | Admitting: Emergency Medicine

## 2018-07-12 ENCOUNTER — Other Ambulatory Visit: Payer: Self-pay

## 2018-07-12 ENCOUNTER — Emergency Department (HOSPITAL_COMMUNITY): Payer: Self-pay

## 2018-07-12 DIAGNOSIS — Z79899 Other long term (current) drug therapy: Secondary | ICD-10-CM | POA: Insufficient documentation

## 2018-07-12 DIAGNOSIS — M25561 Pain in right knee: Secondary | ICD-10-CM | POA: Insufficient documentation

## 2018-07-12 DIAGNOSIS — F17210 Nicotine dependence, cigarettes, uncomplicated: Secondary | ICD-10-CM | POA: Insufficient documentation

## 2018-07-12 DIAGNOSIS — W19XXXA Unspecified fall, initial encounter: Secondary | ICD-10-CM

## 2018-07-12 DIAGNOSIS — M25512 Pain in left shoulder: Secondary | ICD-10-CM | POA: Insufficient documentation

## 2018-07-12 NOTE — ED Notes (Signed)
Bed: WTR5 Expected date:  Expected time:  Means of arrival:  Comments: 

## 2018-07-12 NOTE — ED Notes (Signed)
Ice pack applied to affected area and affected extremity immobilized.

## 2018-07-12 NOTE — ED Provider Notes (Signed)
[facility] DEPT Provider Note   CSN: [id number] Arrival date & time: 07/12/18  6644    History   Chief Complaint Chief Complaint  Patient presents with  . Fall  . Knee Pain    HPI      video translator used during this visit. Brad [name] is a 23 y.o. male otherwise healthy presenting today for right knee pain and left shoulder pain after fall that occurred yesterday at 7 PM.  Patient reports that he was in the shower when he slipped spinning and landing on his right knee, patient reports that he attempted to grab the shower curtain with his left arm causing his left shoulder pain.  Right knee pain mild constant throbbing worsened with movement and palpation improved with rest. Left shoulder pain moderate constant throbbing worsened with movement and palpation improved with rest.  No medications prior to arrival.  Denies head injury/loss consciousness, blood thinner use, neck pain, back pain, chest pain, abdominal pain or pain to the other extremities.     HPI  History reviewed. No pertinent past medical history.  There are no active problems to display for this patient.   History reviewed. No pertinent surgical history.      Home Medications    Prior to Admission medications   Medication Sig Start Date End Date Taking? Authorizing Provider  cyclobenzaprine (FLEXERIL) 10 MG tablet Take 0.5-1 tablets (5-10 mg total) by mouth 2 (two) times daily as needed for muscle spasms. 07/04/17   Margarita Mail, PA-C  docusate sodium (COLACE) 100 MG capsule Take 1 capsule (100 mg total) by mouth every 12 (twelve) hours. 06/30/17   Tanna Furry, MD  meloxicam (MOBIC) 15 MG tablet Take 1 tablet (15 mg total) by mouth daily. 07/04/17   Harris, Abigail, PA-C  ondansetron (ZOFRAN ODT) 4 MG disintegrating tablet Take 1 tablet (4 mg total) by mouth every 8 (eight) hours as needed for nausea. 06/30/17   Tanna Furry, MD  predniSONE (STERAPRED UNI-PAK 21  TAB) 10 MG (21) TBPK tablet Use as sirected 07/04/17   Margarita Mail, PA-C  traMADol (ULTRAM) 50 MG tablet Take 1 tablet (50 mg total) by mouth every 6 (six) hours as needed for severe pain. 07/04/17   Margarita Mail, PA-C    Family History Family History  Problem Relation Age of Onset  . Diabetes Mother     Social History Social History   Tobacco Use  . Smoking status: Current Every Day Smoker    Packs/day: 0.15    Types: Cigarettes  . Smokeless tobacco: Never Used  Substance Use Topics  . Alcohol use: Yes    Comment: occasionally  . Drug use: Yes    Types: Marijuana    Comment: daily     Allergies   Patient has no known allergies.   Review of Systems Review of Systems  Constitutional: Negative.  Negative for chills and fever.  Eyes: Negative.  Negative for visual disturbance.  Gastrointestinal: Negative.  Negative for abdominal pain, nausea and vomiting.  Musculoskeletal: Positive for arthralgias (Right knee, left shoulder). Negative for back pain, joint swelling and neck pain.  Skin: Negative.  Negative for color change and wound.  Neurological: Negative.  Negative for weakness, numbness and headaches.   Physical Exam Updated Vital Signs BP 112/72 (BP Location: Right Arm)   Pulse (!) 58   Temp 98.1 F (36.7 C) (Oral)   Resp 17   SpO2 100%   Physical Exam Constitutional:  General: He is not in acute distress.    Appearance: Normal appearance. He is not ill-appearing or diaphoretic.  HENT:     Head: Normocephalic and atraumatic. No raccoon eyes, Battle's sign, abrasion or contusion.     Jaw: There is normal jaw occlusion. No trismus.     Right Ear: Tympanic membrane, ear canal and external ear normal. No hemotympanum.     Left Ear: Tympanic membrane, ear canal and external ear normal. No hemotympanum.     Ears:     Comments: Hearing grossly intact bilaterally    Nose: Nose normal. No nasal tenderness or rhinorrhea.     Right Nostril: No epistaxis.      Left Nostril: No epistaxis.     Mouth/Throat:     Lips: Pink.     Mouth: Mucous membranes are moist.     Pharynx: Oropharynx is clear. Uvula midline.  Eyes:     General: Vision grossly intact. Gaze aligned appropriately.     Extraocular Movements: Extraocular movements intact.     Conjunctiva/sclera: Conjunctivae normal.     Pupils: Pupils are equal, round, and reactive to light.     Comments: Visual fields grossly intact bilaterally  Neck:     Musculoskeletal: Full passive range of motion without pain, normal range of motion and neck supple. No neck rigidity or spinous process tenderness.     Trachea: Trachea and phonation normal. No tracheal tenderness or tracheal deviation.  Cardiovascular:     Rate and Rhythm: Normal rate and regular rhythm.     Pulses:          Radial pulses are 2+ on the right side and 2+ on the left side.       Dorsalis pedis pulses are 2+ on the right side and 2+ on the left side.     Heart sounds: Normal heart sounds.  Pulmonary:     Effort: Pulmonary effort is normal. No accessory muscle usage or respiratory distress.     Breath sounds: Normal breath sounds and air entry. No decreased breath sounds.  Chest:     Comments: No sign of injury of the chest Abdominal:     General: Bowel sounds are normal. There is no distension.     Palpations: Abdomen is soft.     Tenderness: There is no abdominal tenderness. There is no guarding or rebound.     Comments: No sign of injury of the abdomen  Musculoskeletal:     Left shoulder: He exhibits tenderness. He exhibits no swelling and no deformity.     Right knee: He exhibits normal range of motion, no swelling and no deformity. Tenderness found.       Arms:       Legs:     Comments: No midline C/T/L spinal tenderness to palpation, no deformity, crepitus, or step-off noted. No sign of injury to the neck or back.  Hips stable to compression bilaterally. Patient able to actively bring knees towards chest bilaterally  without pain. - Left Shoulder: Appearance normal. No obvious bony deformity. No skin swelling, erythema, heat, fluctuance or break of the skin. No clavicular deformity or TTP. TTP over anterior deltoid. Active and passive flexion, extension, abduction, adduction, and internal/external rotation intact without pain or crepitus.  Pain with anterior raise.  Strength for flexion, extension, abduction, adduction, and internal/external rotation intact and appropriate for age. Left Elbow: Appearance normal. No obvious bony deformity. No skin swelling, erythema, heat, fluctuance or break of the skin. No TTP over  joint. Active flexion, extension, supination and pronation full and intact without pain. Strength able and appropriate for age for flexion and extension.  Radial Pulse 2+. Cap refill <2 seconds. SILT for M/U/R distributions. Compartments soft.  - Right Knee:   Appearance normal. No obvious deformity. No skin swelling, erythema, heat, fluctuance or break of the skin.  Tenderness to palpation over superior medial patella. Active and passive flexion and extension intact without crepitus with some pain. Negative anterior/poster drawer bilaterally. Negative ballottement test. No varus or valgus laxity or locking. No tenderness to palpation of hips or ankles.Compartments soft. Neurovascularly intact distally to site of injury. - All other major joints brought the range of motion without pain or crepitus.   Feet:     Right foot:     Protective Sensation: 5 sites tested. 5 sites sensed.     Left foot:     Protective Sensation: 5 sites tested. 5 sites sensed.  Skin:    General: Skin is warm and dry.     Capillary Refill: Capillary refill takes less than 2 seconds.  Neurological:     Mental Status: He is alert and oriented to person, place, and time.     GCS: GCS eye subscore is 4. GCS verbal subscore is 5. GCS motor subscore is 6.     Comments: Speech is clear and goal oriented, follows commands Major  Cranial nerves without deficit, no facial droop Normal strength in upper and lower extremities bilaterally including dorsiflexion and plantar flexion, strong and equal grip strength Sensation normal to light and sharp touch Moves extremities without ataxia, coordination intact Slight limp with initial gait but improves with ambulation.  Psychiatric:        Behavior: Behavior is cooperative.    ED Treatments / Results  Labs (all labs ordered are listed, but only abnormal results are displayed) Labs Reviewed - No data to display  EKG None  Radiology Dg Shoulder Left  Result Date: 07/12/2018 CLINICAL DATA:  Pain following fall EXAM: LEFT SHOULDER - 2+ VIEW COMPARISON:  None. FINDINGS: Oblique, Y scapular, and axillary images were obtained. No fracture or dislocation. Joint spaces appear normal. No erosive change. Visualized left lung clear. IMPRESSION: No fracture or dislocation.  No evident arthropathy. Electronically Signed   By: Bretta BangWilliam  Woodruff III M.D.   On: 07/12/2018 10:31   Dg Knee Complete 4 Views Right  Result Date: 07/12/2018 CLINICAL DATA:  Pain following fall EXAM: RIGHT KNEE - COMPLETE 4+ VIEW COMPARISON:  None. FINDINGS: Frontal, lateral, and bilateral oblique views were obtained. No fracture or dislocation. No joint effusion. Joint spaces appear normal. No erosive change. IMPRESSION: No fracture or dislocation. No joint effusion. No appreciable arthropathy. Electronically Signed   By: Bretta BangWilliam  Woodruff III M.D.   On: 07/12/2018 10:32    Procedures Procedures (including critical care time)  Medications Ordered in ED Medications - No data to display   Initial Impression / Assessment and Plan / ED Course  I have reviewed the triage vital signs and the nursing notes.  Pertinent labs & imaging results that were available during my care of the patient were reviewed by me and considered in my medical decision making (see chart for details).    23 year old otherwise  healthy male presenting today for pain of right knee and left shoulder after fall that occurred in tub yesterday at approximately 7 PM.  No head injury, loss consciousness or blood thinner use.  No sign of injury to the neck/back, chest or abdomen.  He is ambulatory without assistance or difficulty.  Some pain with range of motion of the left shoulder, neurovascular intact, compartment soft.  Some pain with range of motion of the right knee, neurovascular intact, compartment soft.  No signs of injury today.  DG right knee:  IMPRESSION: No fracture or dislocation. No joint effusion. No appreciable arthropathy.  DG left shoulder: MPRESSION: No fracture or dislocation.  No evident arthropathy. - Patient reassessed resting comfortably no acute distress he has been updated on imaging findings today.  Suspect musculoskeletal pain at this time, possibility of left rotator cuff injury, advised that occult fracture may be present and that ligamentous/tendon injury or meniscal/rotator cuff injury is possible.  He has been given knee sleeve as well as arm sling today.  He has refused crutches.  All 4 extremities neurovascularly intact; no signs of infection, septic joint, DVT, compartment syndrome.  No evidence of major ligamentous laxity.  No indication for further work-up here in the emergency department. Discussed rice therapy.  Advised follow-up with orthopedics for further evaluation.   At this time there does not appear to be any evidence of an acute emergency medical condition and the patient appears stable for discharge with appropriate outpatient follow up. Diagnosis was discussed with patient who verbalizes understanding of care plan and is agreeable to discharge. I have discussed return precautions with patient and wife who verbalizes understanding of return precautions. Patient encouraged to follow-up with their PCP and ortho. All questions answered.  Video translator was used throughout this visit.   Note: Portions of this report may have been transcribed using voice recognition software. Every effort was made to ensure accuracy; however, inadvertent computerized transcription errors may still be present. Final Clinical Impressions(s) / ED Diagnoses   Final diagnoses:  Fall, initial encounter  Acute pain of right knee  Acute pain of left shoulder    ED Discharge Orders    None       Elizabeth PalauMorelli, Zionah Criswell A, PA-C 07/12/18 1140    Charlynne PanderYao, David Hsienta, MD 07/12/18 1459

## 2018-07-12 NOTE — Discharge Instructions (Addendum)
You have been diagnosed today with fall resulting in right knee pain and left shoulder pain.  At this time there does not appear to be the presence of an emergent medical condition, however there is always the potential for conditions to change. Please read and follow the below instructions.  Please return to the Emergency Department immediately for any new or worsening symptoms. Please be sure to follow up with your Primary Care Provider within one week regarding your visit today; please call their office to schedule an appointment even if you are feeling better for a follow-up visit. You may use the arm sling and knee brace provided to help with your symptoms.  Use rest ice and elevation to help with pain.  Call the orthopedic specialist Dr. Marlou Sa to schedule follow-up appointment.  As discussed unseen fractures may be present and injury of the ligaments and tendons are possible follow-up with orthopedic specialist is advised for further evaluation call his office today.  Get help right away if: Your arm, hand, or fingers: Tingle. Are numb. Are swollen. Are painful. Turn white or blue. Your knee swells, and the swelling gets worse. You cannot move your knee. You have very bad knee pain. You have fever/chills Any new/concerning or worsening symptoms  Please read the additional information packets attached to your discharge summary.  Do not take your medicine if  develop an itchy rash, swelling in your mouth or lips, or difficulty breathing; call 911 and seek immediate emergency medical attention if this occurs. ======= Below has been translated using Google translate.  Errors may be present.  Interpret with caution.   A continuacin se ha traducido [employer] translate. Los errores [employer]. Interpretar con precaucin. ======= Hoy le diagnosticaron una cada que provoc dolor en la rodilla derecha y dolor en el hombro izquierdo.  En este momento no parece existir  la presencia de una condicin mdica emergente, sin embargo, siempre existe la posibilidad de que las condiciones [place] y [place] instrucciones a continuacin.  1. Regrese al Departamento de emergencias de inmediato por cualquier sntoma nuevo o que empeore. 2. Asegrese de hacer un seguimiento con su proveedor de atencin primaria dentro de una semana con respecto a su visita de hoy; llame a su oficina para programar una cita, incluso si se siente mejor para una visita de seguimiento. 3. Puede usar la correa para el brazo y la rodillera que se proporcionan para ayudar con sus sntomas. Use hielo en reposo y elevacin para ayudar con Conservation officer, historic buildings. Llame al especialista en ortopedia Dr. Marlou Sa para programar una cita de seguimiento. Como se discuti, pueden estar presentes fracturas invisibles y es posible que se produzcan lesiones en los ligamentos y tendones. Se recomienda un seguimiento con un especialista en ortopedia para una evaluacin adicional.  Obtenga ayuda de inmediato si: ? Su brazo, mano o dedos: o hormigueo. o estn entumecidos. o Estn hinchados. o Son dolorosas. o Se vuelve blanco o azul. ? Se le hincha la rodilla y la hinchazn empeora. ? No puedes mover tu rodilla. ? Tienes dolor de Training and development officer. ? Tiene fiebre / escalofros. ? Cualquier sntoma nuevo / preocupante o que empeore  Lea los paquetes de informacin adicional adjuntos a su resumen de alta.  No tome su medicamento si desarrolla una erupcin cutnea con picazn, hinchazn en la boca o los labios, o dificultad para respirar; llame al 911 y busque atencin mdica de emergencia inmediata si esto ocurre.

## 2018-07-12 NOTE — ED Triage Notes (Signed)
Patient was showering yesterday and fell. Patient landed on his knee.   Patient C/O right knee pain and left shoulder pain.   Patient ambulatory in triage with no assistance. patient limping.   8/10 pain throbbing pain   A/OX4  Patient speaks spanish only.  Wife speaks english.    WALL-E offered.

## 2018-07-12 NOTE — ED Notes (Signed)
WALL-E at bedside  

## 2019-01-14 ENCOUNTER — Ambulatory Visit (INDEPENDENT_AMBULATORY_CARE_PROVIDER_SITE_OTHER): Payer: Self-pay | Admitting: Family Medicine

## 2019-01-14 ENCOUNTER — Encounter: Payer: Self-pay | Admitting: Family Medicine

## 2019-01-14 ENCOUNTER — Other Ambulatory Visit: Payer: Self-pay

## 2019-01-14 VITALS — BP 102/68 | HR 58 | Temp 98.0°F | Ht 66.0 in | Wt 177.4 lb

## 2019-01-14 DIAGNOSIS — R1084 Generalized abdominal pain: Secondary | ICD-10-CM

## 2019-01-14 DIAGNOSIS — R1031 Right lower quadrant pain: Secondary | ICD-10-CM

## 2019-01-14 DIAGNOSIS — K5900 Constipation, unspecified: Secondary | ICD-10-CM

## 2019-01-14 MED ORDER — MELOXICAM 15 MG PO TABS: 15.0000 mg | ORAL_TABLET | Freq: Every day | ORAL | 0 refills | Status: DC

## 2019-01-14 NOTE — Progress Notes (Signed)
Chief Complaint  Patient presents with  . New Patient (Initial Visit)       New Patient Visit SUBJECTIVE: HPI: Brad [name] is an 23 y.o.male who is being seen for establishing care. Here w a [place] interpreter.  The patient has not had PCP in [place].   1 week ago, started having swelling/pain in R testicular region and R groin region. Hx of hernia on L side, but no issues currently. No urinary complaints or fevers. Has been having BM's q 3-4 d, which is abn. Usually can go 1-3 times per day. No bleeding, inj, diarrhea, N/V. Lower abd pain sharp in nature without radiation. BM's help. He is passing gas. No hx of surgeries. Has not tried anything at home. Does not drink much water routinely.   Past Medical History:  Diagnosis Date  . No known health problems    Past Surgical History:  Procedure Laterality Date  . NO PAST SURGERIES     Family History  Problem Relation Age of Onset  . Diabetes Mother   . Diabetes Maternal Grandmother   . Diabetes Maternal Grandfather   . Cancer Maternal Grandfather    No Known Allergies  Current Outpatient Medications:  .  meloxicam (MOBIC) 15 MG tablet, Take 1 tablet (15 mg total) by mouth daily., Disp: 30 tablet, Rfl: 0  ROS Const:  Denies fevers  GI: Denies diarrhea   OBJECTIVE: BP 102/68 (BP Location: Left Arm, Patient Position: Sitting, Cuff Size: Normal)   Pulse (!) 58   Temp 98 F (36.7 C) (Temporal)   Ht 5\' 6"  (1.676 m)   Wt 177 lb 6 oz (80.5 kg)   SpO2 98%   BMI 28.63 kg/m  General:  well developed, well nourished, in no apparent distress Skin:  no significant moles, warts, or growths Head:  no masses, lesions, or tenderness Eyes:  pupils equal and round, sclera anicteric without injection Ears:  canals without lesions, TMs shiny without retraction, no obvious effusion, no erythema Nose:  nares patent, septum midline, mucosa normal Throat/Pharynx:  lips and gingiva without lesion; tongue and uvula midline;  non-inflamed pharynx; no exudates or postnasal drainage GU: TTP w digit insertion into R inguinal canal, no bulges appreciated, no testicular ttp Lungs:  clear to auscultation, breath sounds equal bilaterally, no respiratory distress Cardio:  regular rate and rhythm, no LE edema or bruits Rectal: Deferred GI: BS+, S, TTP diffusely, ND Neuro:  gait normal; deep tendon reflexes normal and symmetric Psych: well oriented with normal range of affect and appropriate judgment/insight  ASSESSMENT/PLAN: Generalized abdominal pain  Constipation, unspecified constipation type  Right inguinal pain - Plan: US Abdomen Limited, meloxicam (MOBIC) 15 MG tablet  1/2- MiraLAX 1-2 times daily for 3-4 days, use enema if no improvement. Increase water intake to 60 oz daily.  3- Ck for hernia w [place]. NSAIDs. Patient should return pending above. The patient voiced understanding and agreement to the plan.   [place], DO 01/14/19  3:35 PM

## 2019-01-14 NOTE — Patient Instructions (Addendum)
Try MiraLAX 1-2 times daily over the next 3-4 days. If no improvement, try using an enema. Stay well hydrated and keep lots of fiber in your diet.  Stay hydrated. Try to get around 60 oz of water daily.   We will be in touch regarding your [place] results.   Ice/cold pack over area for 10-15 min twice daily.  OK to take Tylenol 1000 mg (2 extra strength tabs) or 975 mg (3 regular strength tabs) every 6 hours as needed.  Alternative to prescription (meloxicam): Ibuprofen 400-600 mg (2-3 over the counter strength tabs) every 6 hours as needed for pain.  Let us know if you need anything.

## 2019-07-10 ENCOUNTER — Encounter: Payer: Self-pay | Admitting: Gastroenterology

## 2019-07-10 ENCOUNTER — Encounter: Payer: Self-pay | Admitting: Family Medicine

## 2019-07-10 ENCOUNTER — Ambulatory Visit (INDEPENDENT_AMBULATORY_CARE_PROVIDER_SITE_OTHER): Payer: Self-pay | Admitting: Family Medicine

## 2019-07-10 ENCOUNTER — Other Ambulatory Visit: Payer: Self-pay

## 2019-07-10 VITALS — BP 120/68 | HR 69 | Temp 96.8°F | Ht 65.0 in | Wt 185.0 lb

## 2019-07-10 DIAGNOSIS — R42 Dizziness and giddiness: Secondary | ICD-10-CM

## 2019-07-10 DIAGNOSIS — K5909 Other constipation: Secondary | ICD-10-CM

## 2019-07-10 NOTE — Patient Instructions (Addendum)
If you do not hear anything about your referral in the next 1-2 weeks, call our office and ask for an update.  Stay hydrated.  The dizziness you are feeling is positional (when you rise).   When you poop and have dizziness, that has to do with bearing down/pushing.   Let us know if you need anything.

## 2019-07-10 NOTE — Progress Notes (Signed)
Chief Complaint  Patient presents with  . Dizziness    Subjective: Patient is a 24 y.o. male here for constipation. Here w aid of Spanish interpreter. Also here w GF.  3 yrs of constipation. No routine bleeding, has noticed twice over this time. +abd pain in distribution of the colon. No N/V or fevers. Passes minimal gas. Drinks around 64 oz/d. Tried MiraLAX, fiber, suppositories and various other stool softeners without relief.   Past Medical History:  Diagnosis Date  . No known health problems     Objective: BP 120/68 (BP Location: Left Arm, Patient Position: Sitting, Cuff Size: Normal)   Pulse 69   Temp (!) 96.8 F (36 C) (Temporal)   Ht 5\' 5"  (1.651 m)   Wt 185 lb (83.9 kg)   SpO2 98%   BMI 30.79 kg/m  General: Awake, appears stated age HEENT: MMM Abd: BS+, S, mild L sided abd pain, ND Heart: RRR Lungs: CTAB, no rales, wheezes or rhonchi. No accessory muscle use Psych: Age appropriate judgment and insight, normal affect and mood  Assessment and Plan: Chronic constipation - Plan: Ambulatory referral to Gastroenterology  Light headed - Plan: CANCELED: CBC, CANCELED: Comprehensive metabolic panel  I initially rec'd fiber supp, but after finding he has been on that + more, decided together that seeing the GI team would be best. Hold on labs. Vasovagal from bearing down causing lightheadedness.  F/u prn.  The patient and his GF voiced understanding and agreement to the plan.  [place], DO 07/10/19  11:47 AM

## 2019-09-03 ENCOUNTER — Other Ambulatory Visit (INDEPENDENT_AMBULATORY_CARE_PROVIDER_SITE_OTHER): Payer: Self-pay

## 2019-09-03 ENCOUNTER — Ambulatory Visit (INDEPENDENT_AMBULATORY_CARE_PROVIDER_SITE_OTHER)
Admission: RE | Admit: 2019-09-03 | Discharge: 2019-09-03 | Disposition: A | Discharge status: Home / Self Care | Payer: Self-pay | Source: Ambulatory Visit | Attending: Gastroenterology | Admitting: Gastroenterology

## 2019-09-03 ENCOUNTER — Other Ambulatory Visit: Payer: Self-pay

## 2019-09-03 ENCOUNTER — Ambulatory Visit: Payer: Self-pay | Admitting: Gastroenterology

## 2019-09-03 ENCOUNTER — Encounter: Payer: Self-pay | Admitting: Gastroenterology

## 2019-09-03 VITALS — BP 110/66 | HR 73 | Ht 66.25 in | Wt 179.0 lb

## 2019-09-03 DIAGNOSIS — K5900 Constipation, unspecified: Secondary | ICD-10-CM

## 2019-09-03 DIAGNOSIS — R6881 Early satiety: Secondary | ICD-10-CM

## 2019-09-03 DIAGNOSIS — R634 Abnormal weight loss: Secondary | ICD-10-CM

## 2019-09-03 DIAGNOSIS — K921 Melena: Secondary | ICD-10-CM

## 2019-09-03 DIAGNOSIS — R1084 Generalized abdominal pain: Secondary | ICD-10-CM

## 2019-09-03 DIAGNOSIS — R195 Other fecal abnormalities: Secondary | ICD-10-CM

## 2019-09-03 LAB — BASIC METABOLIC PANEL
BUN: 6 mg/dL (ref 6–23)
CO2: 28 mEq/L (ref 19–32)
Calcium: 9.3 mg/dL (ref 8.4–10.5)
Chloride: 107 mEq/L (ref 96–112)
Creatinine, Ser: 0.85 mg/dL (ref 0.40–1.50)
GFR: 109.55 mL/min (ref >60.00)
Glucose, Bld: 84 mg/dL (ref 70–99)
Potassium: 3.8 mEq/L (ref 3.5–5.1)
Sodium: 139 mEq/L (ref 135–145)

## 2019-09-03 LAB — CBC
HCT: 42.8 % (ref 39.0–52.0)
Hemoglobin: 14.7 g/dL (ref 13.0–17.0)
MCHC: 34.5 g/dL (ref 30.0–36.0)
MCV: 92.5 fl (ref 78.0–100.0)
Platelets: 210 10*3/uL (ref 150.0–400.0)
RBC: 4.62 Mil/uL (ref 4.22–5.81)
RDW: 12.7 % (ref 11.5–15.5)
WBC: 8.2 10*3/uL (ref 4.0–10.5)

## 2019-09-03 LAB — TSH: TSH: 1.52 u[IU]/mL (ref 0.35–4.50)

## 2019-09-03 NOTE — Patient Instructions (Addendum)
If you are age 25 or older, your body mass index should be between 23-30. Your Body mass index is 28.67 kg/m. If this is out of the aforementioned range listed, please consider follow up with your Primary Care Provider.  If you are age 62 or younger, your body mass index should be between 19-25. Your Body mass index is 28.67 kg/m. If this is out of the aformentioned range listed, please consider follow up with your Primary Care Provider.   Your provider has requested that you go to the basement level for lab work and X-ray at [facility]. [place], [place] [address] on the [address], [address].    You have been scheduled for an endoscopy and colonoscopy. Please follow the written instructions given to you at your visit today. Please pick up your prep supplies at the pharmacy within the next 1-3 days.  YOU HAVE BEEN GIVEN A CLENPIQ SAMPLE.  PLEASE PICK UP DULCOLAX 10 MG TABLETS - YOU WILL NEED 8! If you use inhalers (even only as needed), please bring them with you on the day of your procedure.   It was a pleasure to see you today!  Vito Cirigliano, D.O.

## 2019-09-03 NOTE — Progress Notes (Signed)
Chief Complaint: Chronic constipation, weight loss, hematochezia  Referring Provider:     Sharlene Dory, DO   HPI:    Brad [name] is a 24 y.o. male referred to the Gastroenterology Clinic for evaluation of chronic constipation.  History obtained Via Spanish interpreter.  He reports a 2+ year history of chronic constipation.  Has had episodes of BRBPR over that time,last episode 2 days ago-can range from scant BRB on tissue paper to filling toilet water and occasional clot.  Generalized abdominal pain which is nonlocalizing and nonradiating. Can be sxs free for a month at a time, then it recurs. Can go up to 4-5 days w/o BM.   Has sensation to have BM, but straining to have BM and prolonged time on toilet. Can have flat stools. "Pushing against brick wall".   Baseline 2-3 soft stools/day prior to symptoms onset. No clear provoking events, medications, etc.   Has trialed MiraLAX, fiber supplement, suppositories, OTC stool softeners without durable relief.  Drinks >64 oz water/day.  Separately, he also c/o abdominal fullness and early satiety with subsequent weight loss from 185# --> 175#. Increased belching. Sxs have occurred over the same time period. No HB, regurgitation. No f/c.   More recently with fatigue.  No fever, chills, night sweats.  No recent labs or abdominal imaging for review.  CT from 07/2017 unremarkable GI tract.  No previous EGD/colonoscopy.   Past Medical History:  Diagnosis Date  . No known health problems      Past Surgical History:  Procedure Laterality Date  . NO PAST SURGERIES    . OTHER SURGICAL HISTORY     On Penis   Family History  Problem Relation Age of Onset  . Diabetes Mother   . Diabetes Maternal Grandmother   . Diabetes Maternal Grandfather   . Cancer Maternal Grandfather   . Prostate cancer Maternal Grandfather   . Colon cancer Neg Hx   . Esophageal cancer Neg Hx    Social History   Tobacco Use  .  Smoking status: Current Every Day Smoker    Packs/day: 0.15    Types: Cigarettes  . Smokeless tobacco: Never Used  . Tobacco comment: 10-15 cigarettes a day   Vaping Use  . Vaping Use: Some days  . Devices: THC  Substance Use Topics  . Alcohol use: Yes    Comment: occasionally  . Drug use: Yes    Types: Marijuana    Comment: daily   No current outpatient medications on file.   No current facility-administered medications for this visit.   No Known Allergies   Review of Systems: All systems reviewed and negative except where noted in HPI.     Physical Exam:    Wt Readings from Last 3 Encounters:  09/03/19 179 lb (81.2 kg)  07/10/19 185 lb (83.9 kg)  01/14/19 177 lb 6 oz (80.5 kg)    BP 110/66   Pulse 73   Ht 5' 6.25" (1.683 m)   Wt 179 lb (81.2 kg)   BMI 28.67 kg/m  Constitutional:  Pleasant, in no acute distress. Psychiatric: Normal mood and affect. Behavior is normal. EENT: Pupils normal.  Conjunctivae are normal. No scleral icterus. Neck supple. No cervical LAD. Cardiovascular: Normal rate, regular rhythm. No edema Pulmonary/chest: Effort normal and breath sounds normal. No wheezing, rales or rhonchi. Abdominal: Mild TTP throughout the abdomen without focal tenderness.  No rebound or guarding.  No  peritoneal signs.  Soft, nondistended. Bowel sounds active throughout. There are no masses palpable. No hepatomegaly. Neurological: Alert and oriented to person place and time. Skin: Skin is warm and dry. No rashes noted. Rectal: Exam deferred by patient to time of colonoscopy.    ASSESSMENT AND PLAN;   1) Chronic constipation 2) Generalized abdominal pain 3) Hematochezia 4) Change in bowel habits  2+ year history of constipation.  Discussed broad DDx.  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  Given change in stool shape, hematochezia, prolonged symptoms, increasing weight bothersome, heightened patient concerns, plan for the  following: -Colonoscopy to evaluate for mucosal/luminal pathology -Continue adequate hydration -Okay to start Dulcolax in the meantime -Treated prep -Check TSH, CBC, BMP -Check KUB -Anorectal exam at time of colonoscopy/sedation to evaluate for healing fissure -If clinically significant hemorrhoids, can discuss hemorrhoid band ligation -Consider referral for ARM and pelvic floor PT pending Colonoscopy  5) Weight loss 6) Early satiety -EGD to assess for mucosal/luminal pathology  The indications, risks, and benefits of EGD and colonoscopy were explained to the patient in detail. Risks include but are not limited to bleeding, perforation, adverse reaction to medications, and cardiopulmonary compromise. Sequelae include but are not limited to the possibility of surgery, hositalization, and mortality. The patient verbalized understanding and wished to proceed. All questions answered, referred to scheduler and bowel prep ordered. Further recommendations pending results of the exam.    Shellia Cleverly, DO, FACG  09/03/2019, 1:58 PM   [place], Jilda Roche*

## 2019-09-10 ENCOUNTER — Telehealth: Payer: Self-pay | Admitting: Gastroenterology

## 2019-09-10 NOTE — Telephone Encounter (Signed)
Spoke with pt and informed him of lab results per Dr Barron Alvine. Pt voiced understanding and will keep appt for 8/12

## 2019-09-11 ENCOUNTER — Encounter: Payer: Self-pay | Admitting: Certified Registered Nurse Anesthetist

## 2019-09-12 ENCOUNTER — Other Ambulatory Visit: Payer: Self-pay

## 2019-09-12 ENCOUNTER — Ambulatory Visit (AMBULATORY_SURGERY_CENTER): Payer: Self-pay | Admitting: Gastroenterology

## 2019-09-12 ENCOUNTER — Encounter: Payer: Self-pay | Admitting: Gastroenterology

## 2019-09-12 VITALS — BP 116/67 | HR 74 | Temp 99.5°F | Resp 20 | Ht 66.0 in | Wt 179.0 lb

## 2019-09-12 DIAGNOSIS — R194 Change in bowel habit: Secondary | ICD-10-CM

## 2019-09-12 DIAGNOSIS — K2970 Gastritis, unspecified, without bleeding: Secondary | ICD-10-CM

## 2019-09-12 DIAGNOSIS — K2211 Ulcer of esophagus with bleeding: Secondary | ICD-10-CM

## 2019-09-12 DIAGNOSIS — K2210 Ulcer of esophagus without bleeding: Secondary | ICD-10-CM

## 2019-09-12 DIAGNOSIS — R6881 Early satiety: Secondary | ICD-10-CM

## 2019-09-12 DIAGNOSIS — K5900 Constipation, unspecified: Secondary | ICD-10-CM

## 2019-09-12 DIAGNOSIS — R1084 Generalized abdominal pain: Secondary | ICD-10-CM

## 2019-09-12 DIAGNOSIS — K2100 Gastro-esophageal reflux disease with esophagitis, without bleeding: Secondary | ICD-10-CM

## 2019-09-12 DIAGNOSIS — K2990 Gastroduodenitis, unspecified, without bleeding: Secondary | ICD-10-CM

## 2019-09-12 DIAGNOSIS — R634 Abnormal weight loss: Secondary | ICD-10-CM

## 2019-09-12 DIAGNOSIS — K3189 Other diseases of stomach and duodenum: Secondary | ICD-10-CM

## 2019-09-12 DIAGNOSIS — K642 Third degree hemorrhoids: Secondary | ICD-10-CM

## 2019-09-12 DIAGNOSIS — K921 Melena: Secondary | ICD-10-CM

## 2019-09-12 DIAGNOSIS — K209 Esophagitis, unspecified without bleeding: Secondary | ICD-10-CM

## 2019-09-12 DIAGNOSIS — R195 Other fecal abnormalities: Secondary | ICD-10-CM

## 2019-09-12 MED ORDER — SODIUM CHLORIDE 0.9 % IV SOLN
500.0000 mL | Freq: Once | INTRAVENOUS | Status: DC
Start: 2019-09-12 — End: 2020-09-30

## 2019-09-12 MED ORDER — PANTOPRAZOLE SODIUM 40 MG PO TBEC: 40.0000 mg | DELAYED_RELEASE_TABLET | Freq: Two times a day (BID) | ORAL | 1 refills | Status: DC

## 2019-09-12 NOTE — Progress Notes (Signed)
Vital signs checked by:BC  The medical and surgical history was reviewed and verified with the patient. 

## 2019-09-12 NOTE — Op Note (Signed)
[facility] Patient Name: Brad [name] Procedure Date: 09/12/2019 2:30 PM MRN: [id number] Endoscopist: Doristine Locks , MD Age: 24 Referring MD:  Date of Birth: 12-09-1995 Gender: Male Account #: [id number] Procedure:                Colonoscopy Indications:              Generalized abdominal pain, Hematochezia, Change in                            bowel habits, Constipation Medicines:                Monitored Anesthesia Care Procedure:                Pre-Anesthesia Assessment:                           - Prior to the procedure, a History and Physical                            was performed, and patient medications and                            allergies were reviewed. The patient's tolerance of                            previous anesthesia was also reviewed. The risks                            and benefits of the procedure and the sedation                            options and risks were discussed with the patient.                            All questions were answered, and informed consent                            was obtained. Prior Anticoagulants: The patient has                            taken no previous anticoagulant or antiplatelet                            agents. ASA Grade Assessment: I - A normal, healthy                            patient. After reviewing the risks and benefits,                            the patient was deemed in satisfactory condition to                            undergo the procedure.  After obtaining informed consent, the colonoscope                            was passed under direct vision. Throughout the                            procedure, the patient's blood pressure, pulse, and                            oxygen saturations were monitored continuously. The                            Colonoscope was introduced through the anus and                            advanced to the the terminal ileum. The  colonoscopy                            was performed without difficulty. The patient                            tolerated the procedure well. The quality of the                            bowel preparation was good. The terminal ileum,                            ileocecal valve, appendiceal orifice, and rectum                            were photographed. Scope In: 2:53:05 PM Scope Out: 3:06:07 PM Scope Withdrawal Time: 0 hours 10 minutes 43 seconds  Total Procedure Duration: 0 hours 13 minutes 2 seconds  Findings:                 Hemorrhoids were found on perianal exam.                           The colon (entire examined portion) appeared                            normal. Biopsies for histology were taken with a                            cold forceps from the right colon and left colon                            for evaluation of microscopic colitis. Estimated                            blood loss was minimal.                           Non-bleeding internal hemorrhoids were found during  retroflexion. The hemorrhoids were medium-sized and                            Grade III (internal hemorrhoids that prolapse but                            require manual reduction).                           The terminal ileum appeared normal. Complications:            No immediate complications. Estimated Blood Loss:     Estimated blood loss was minimal. Impression:               - Hemorrhoids found on perianal exam.                           - The entire examined colon is normal. Biopsied.                           - Non-bleeding internal hemorrhoids.                           - The examined portion of the ileum was normal. Recommendation:           - Patient has a contact number available for                            emergencies. The signs and symptoms of potential                            delayed complications were discussed with the                             patient. Return to normal activities tomorrow.                            Written discharge instructions were provided to the                            patient.                           - Resume previous diet.                           - Continue present medications.                           - Await pathology results.                           - Repeat colonoscopy at age 65 for screening                            purposes.                           -  Use fiber, for example Citrucel, Fibercon, Konsyl                            or Metamucil.                           - Internal hemorrhoids were noted on this study and                            may be amenable to hemorrhoid band ligation. If you                            are interested in further treatment of these                            hemorrhoids with band ligation, please contact my                            clinic to set up an appointment for evaluation and                            treatment. Doristine LocksVito Samreen Seltzer, MD 09/12/2019 3:23:26 PM

## 2019-09-12 NOTE — Discharge Instructions (Signed)
Resume previous medications. New medication sent to pharmacy. Increase fiber or add fiber supplement. Handouts on findings given to patient. (hemorrhoids, gastritis, esophagitis) Await pathology for final recommendations.  YOU HAD AN ENDOSCOPIC PROCEDURE TODAY AT THE [facility]:   Refer to the procedure report that was given to you for any specific questions about what was found during the examination.  If the procedure report does not answer your questions, please call your gastroenterologist to clarify.  If you requested that your care partner not be given the details of your procedure findings, then the procedure report has been included in a sealed envelope for you to review at your convenience later.  YOU SHOULD EXPECT: Some feelings of bloating in the abdomen. Passage of more gas than usual.  Walking can help get rid of the air that was put into your GI tract during the procedure and reduce the bloating. If you had a lower endoscopy (such as a colonoscopy or flexible sigmoidoscopy) you may notice spotting of blood in your stool or on the toilet paper. If you underwent a bowel prep for your procedure, you may not have a normal bowel movement for a few days.  Please Note:  You might notice some irritation and congestion in your nose or some drainage.  This is from the oxygen used during your procedure.  There is no need for concern and it should clear up in a day or so.  SYMPTOMS TO REPORT IMMEDIATELY:   Following lower endoscopy (colonoscopy or flexible sigmoidoscopy):  Excessive amounts of blood in the stool  Significant tenderness or worsening of abdominal pains  Swelling of the abdomen that is new, acute  Fever of 100F or higher   Following upper endoscopy (EGD)  Vomiting of blood or coffee ground material  New chest pain or pain under the shoulder blades  Painful or persistently difficult swallowing  New shortness of breath  Fever of 100F or higher  Black,  tarry-looking stools  For urgent or emergent issues, a gastroenterologist can be reached at any hour by calling [phone]. Do not use MyChart messaging for urgent concerns.    DIET:  We do recommend a small meal at first, but then you may proceed to your regular diet.  Drink plenty of fluids but you should avoid alcoholic beverages for 24 hours.  ACTIVITY:  You should plan to take it easy for the rest of today and you should NOT DRIVE or use heavy machinery until tomorrow (because of the sedation medicines used during the test).    FOLLOW UP: Our staff will call the number listed on your records 48-72 hours following your procedure to check on you and address any questions or concerns that you may have regarding the information given to you following your procedure. If we do not reach you, we will leave a message.  We will attempt to reach you two times.  During this call, we will ask if you have developed any symptoms of COVID 19. If you develop any symptoms (ie: fever, flu-like symptoms, shortness of breath, cough etc.) before then, please call [phone].  If you test positive for Covid 19 in the 2 weeks post procedure, please call and report this information to [place].    If any biopsies were taken you will be contacted by phone or by letter within the next 1-3 weeks.  Please call us at [phone] if you have not heard about the biopsies in 3 weeks.    SIGNATURES/CONFIDENTIALITY: You  and/or your care partner have signed paperwork which will be entered into your electronic medical record.  These signatures attest to the fact that that the information above on your After Visit Summary has been reviewed and is understood.  Full responsibility of the confidentiality of this discharge information lies with you and/or your care-partner.

## 2019-09-12 NOTE — Progress Notes (Signed)
Interpreter used today at the [facility] for this pt.  Interpreter's name is- Elna Breslow.

## 2019-09-12 NOTE — Progress Notes (Signed)
Report given to PACU, vss 

## 2019-09-12 NOTE — Progress Notes (Signed)
Robinul 0.1 mg IV given due large amount of secretions upon assessment.  MD made aware, vss 

## 2019-09-12 NOTE — Progress Notes (Signed)
Called to room to assist during endoscopic procedure.  Patient ID and intended procedure confirmed with present staff. Received instructions for my participation in the procedure from the performing physician.  

## 2019-09-12 NOTE — Op Note (Signed)
[facility] Patient Name: Brad CapeSamuel [name] Procedure Date: 09/12/2019 2:30 PM MRN: [id number] Endoscopist: Doristine LocksVito Normagene Harvie , MD Age: 24 Referring MD:  Date of Birth: Nov 15, 1995 Gender: Male Account #: [id number] Procedure:                Upper GI endoscopy Indications:              Generalized abdominal pain, Early satiety,                            Eructation, Weight loss, Diarrhea, Abdominal                            fullness Medicines:                Monitored Anesthesia Care Procedure:                Pre-Anesthesia Assessment:                           - Prior to the procedure, a History and Physical                            was performed, and patient medications and                            allergies were reviewed. The patient's tolerance of                            previous anesthesia was also reviewed. The risks                            and benefits of the procedure and the sedation                            options and risks were discussed with the patient.                            All questions were answered, and informed consent                            was obtained. Prior Anticoagulants: The patient has                            taken no previous anticoagulant or antiplatelet                            agents. ASA Grade Assessment: I - A normal, healthy                            patient. After reviewing the risks and benefits,                            the patient was deemed in satisfactory condition to  undergo the procedure.                           After obtaining informed consent, the endoscope was                            passed under direct vision. Throughout the                            procedure, the patient's blood pressure, pulse, and                            oxygen saturations were monitored continuously. The                            Endoscope was introduced through the mouth, and                             advanced to the second part of duodenum. The upper                            GI endoscopy was accomplished without difficulty.                            The patient tolerated the procedure well. Scope In: Scope Out: Findings:                 LA Grade C (one or more mucosal breaks continuous                            between tops of 2 or more mucosal folds, less than                            75% circumference) esophagitis with ulceration but                            no bleeding was found in the lower third of the                            esophagus. Biopsies were taken with a cold forceps                            for histology. Estimated blood loss was minimal.                           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                           Segmental moderate inflammation characterized by  congestion (edema) and erythema was found in the                            gastric body and in the gastric antrum. Biopsies                            were taken with a cold forceps for Helicobacter                            pylori testing. Estimated blood loss was minimal.                           The duodenal bulb, first portion of the duodenum                            and second portion of the duodenum were normal.                            Biopsies for histology were taken with a cold                            forceps for evaluation of celiac disease. Estimated                            blood loss was minimal. Complications:            No immediate complications. Estimated Blood Loss:     Estimated blood loss was minimal. Impression:               - LA Grade C esophagitis with no bleeding. Biopsied.                           - Ectopic gastric mucosa in the upper third of the                            esophagus.                           - Gastritis. Biopsied.                            - Normal duodenal bulb, first portion of the                            duodenum and second portion of the duodenum.                            Biopsied. Recommendation:           - Patient has a contact number available for                            emergencies. The signs and symptoms of potential  delayed complications were discussed with the                            patient. Return to normal activities tomorrow.                            Written discharge instructions were provided to the                            patient.                           - Resume previous diet.                           - Continue present medications.                           - Await pathology results.                           - Use Protonix (pantoprazole) 40 mg PO BID for 8                            weeks to promote mucosal healing, then reduce to 40                            mg/day and can discontinue if upper GI symptoms                            resolve. Doristine Locks, MD 09/12/2019 3:12:46 PM

## 2019-09-16 ENCOUNTER — Telehealth: Payer: Self-pay

## 2019-09-16 NOTE — Telephone Encounter (Signed)
2nd follow up call made.  NAULM 

## 2019-09-16 NOTE — Telephone Encounter (Signed)
First post procedure follow up call, no answer 

## 2019-09-20 ENCOUNTER — Encounter: Payer: Self-pay | Admitting: Gastroenterology

## 2019-10-15 ENCOUNTER — Telehealth: Payer: Self-pay | Admitting: Gastroenterology

## 2019-10-15 NOTE — Telephone Encounter (Signed)
Spoke to patients wife who states that patient is still experiencing abdominal pain. He had an EGD on 09/12/19 and was started on Protonix 40 mg twice daily  To promote mucosal healing. Patient continues to smoke marijuana and drink beer daily. He was advised to stop consuming both and eat less fried and spicy foods. Patient agreed to this plan and will call back next week if no improvement in his symptoms. He has been scheduled for a hemorrhoid banding next month.

## 2019-11-19 ENCOUNTER — Ambulatory Visit: Payer: Self-pay | Admitting: Gastroenterology

## 2019-12-09 ENCOUNTER — Ambulatory Visit: Payer: Self-pay | Admitting: Gastroenterology

## 2020-05-06 ENCOUNTER — Telehealth: Payer: Self-pay | Admitting: Gastroenterology

## 2020-05-06 NOTE — Telephone Encounter (Signed)
Patient scheduled for 07/09/2020 for banding; informed to try to keep appt as he has been scheduled before and canceled them.

## 2020-05-06 NOTE — Telephone Encounter (Signed)
Inbound call from patient's wife wanting to schedule hemorrhoid banding for patient.  Ok to schedule?

## 2020-05-18 ENCOUNTER — Encounter (HOSPITAL_BASED_OUTPATIENT_CLINIC_OR_DEPARTMENT_OTHER): Payer: Self-pay

## 2020-05-18 ENCOUNTER — Emergency Department (HOSPITAL_BASED_OUTPATIENT_CLINIC_OR_DEPARTMENT_OTHER): Payer: Self-pay

## 2020-05-18 ENCOUNTER — Other Ambulatory Visit: Payer: Self-pay

## 2020-05-18 ENCOUNTER — Emergency Department (HOSPITAL_BASED_OUTPATIENT_CLINIC_OR_DEPARTMENT_OTHER)
Admission: EM | Admit: 2020-05-18 | Discharge: 2020-05-18 | Disposition: A | Discharge status: Home / Self Care | Payer: Self-pay | Attending: Emergency Medicine | Admitting: Emergency Medicine

## 2020-05-18 DIAGNOSIS — S60551A Superficial foreign body of right hand, initial encounter: Secondary | ICD-10-CM

## 2020-05-18 DIAGNOSIS — Y990 Civilian activity done for income or pay: Secondary | ICD-10-CM | POA: Insufficient documentation

## 2020-05-18 DIAGNOSIS — F17210 Nicotine dependence, cigarettes, uncomplicated: Secondary | ICD-10-CM | POA: Insufficient documentation

## 2020-05-18 DIAGNOSIS — W278XXA Contact with other nonpowered hand tool, initial encounter: Secondary | ICD-10-CM | POA: Insufficient documentation

## 2020-05-18 DIAGNOSIS — S6991XA Unspecified injury of right wrist, hand and finger(s), initial encounter: Secondary | ICD-10-CM | POA: Insufficient documentation

## 2020-05-18 MED ORDER — IBUPROFEN 400 MG PO TABS
600.0000 mg | ORAL_TABLET | Freq: Once | ORAL | Status: AC
  Administered 2020-05-18: 600 mg via ORAL
  Filled 2020-05-18: qty 1

## 2020-05-18 NOTE — ED Notes (Signed)
See EDP assessment; pt stable at discharge  

## 2020-05-18 NOTE — ED Provider Notes (Signed)
[facility] EMERGENCY DEPARTMENT Provider Note   CSN: [id number] Arrival date & time: 05/18/20  1634     History Chief Complaint  Patient presents with  . Hand Injury    Brad [name] is a 25 y.o. male.  Brad [name] is a 25 y.o. male who is otherwise healthy, presents to the ED for evaluation of pain to his right hand.  He reports 3 weeks ago while working he hit his hand over the second MCP joint with a hammer.  He reports that over the past 3 weeks he has continued to have pain in this area.  He reports he thinks when it first happened he may have dislocated his finger but he was able to shift it back on the job site and then kept working.  He reports he feels like there is something in his finger that he feels moving around sometimes when he flexes and extends the second digit.  Denies any numbness tingling or weakness.  Reports while at work today pain started getting worse, it tends to hurt more especially when he has to grip heavy objects.  He denies any redness swelling or wounds over the area.  He has not been taking anything for pain.  No other aggravating or alleviating factors.        Past Medical History:  Diagnosis Date  . No known health problems     There are no problems to display for this patient.   Past Surgical History:  Procedure Laterality Date  . NO PAST SURGERIES    . OTHER SURGICAL HISTORY     On Penis       Family History  Problem Relation Age of Onset  . Diabetes Mother   . Diabetes Maternal Grandmother   . Diabetes Maternal Grandfather   . Cancer Maternal Grandfather   . Prostate cancer Maternal Grandfather   . Colon cancer Neg Hx   . Esophageal cancer Neg Hx   . Rectal cancer Neg Hx   . Stomach cancer Neg Hx     Social History   Tobacco Use  . Smoking status: Current Every Day Smoker    Packs/day: 0.15    Types: Cigarettes  . Smokeless tobacco: Never Used  . Tobacco comment: 10-15 cigarettes a  day   Vaping Use  . Vaping Use: Some days  . Devices: THC  Substance Use Topics  . Alcohol use: Not Currently  . Drug use: Yes    Types: Marijuana    Comment: daily    Home Medications Prior to Admission medications   Medication Sig Start Date End Date Taking? Authorizing Provider  pantoprazole (PROTONIX) 40 MG tablet Take 1 tablet (40 mg total) by mouth 2 (two) times daily. 09/12/19   Cirigliano, Vito V, DO    Allergies    Patient has no known allergies.  Review of Systems   Review of Systems  Constitutional: Negative for chills and fever.  Musculoskeletal: Positive for arthralgias.  Skin: Negative for color change, rash and wound.  Neurological: Negative for weakness and numbness.  All other systems reviewed and are negative.   Physical Exam Updated Vital Signs BP 136/90 (BP Location: Right Arm)   Pulse (!) 108   Temp 98.4 F (36.9 C) (Oral)   Resp 16   Ht 5\' 6"  (1.676 m)   Wt 82.6 kg   SpO2 100%   BMI 29.38 kg/m   Physical Exam Vitals and nursing note reviewed.  Constitutional:  General: He is not in acute distress.    Appearance: Normal appearance. He is well-developed and normal weight. He is not ill-appearing or diaphoretic.  HENT:     Head: Normocephalic and atraumatic.  Eyes:     General:        Right eye: No discharge.        Left eye: No discharge.  Pulmonary:     Effort: Pulmonary effort is normal. No respiratory distress.  Musculoskeletal:        General: Tenderness present.     Comments: Tenderness over the second MCP joint, seems to be a small foreign body palpable just above the MCP joint, but this seems to move and then is more difficult to palpate.  Patient with increased pain with movement, in particular with full extension of the finger.  Slight swelling over the joint, no swelling extending into the finger.  Normal sensation.  Normal cap refill.  Skin:    General: Skin is warm and dry.  Neurological:     Mental Status: He is alert  and oriented to person, place, and time.     Coordination: Coordination normal.  Psychiatric:        Mood and Affect: Mood normal.        Behavior: Behavior normal.     ED Results / Procedures / Treatments   Labs (all labs ordered are listed, but only abnormal results are displayed) Labs Reviewed - No data to display  EKG None  Radiology DG Hand Complete Right  Result Date: 05/18/2020 CLINICAL DATA:  Blunt trauma to the hand with a hammer 3 weeks ago with persistent pain, initial encounter EXAM: RIGHT HAND - COMPLETE 3+ VIEW COMPARISON:  None. FINDINGS: No acute fracture or dislocation is noted. Radiopaque foreign body is noted adjacent to the posterior aspect of the second MCP joint which may be related to the recent injury and possible retained nail fragment. Correlation with the clinical history is recommended. No other focal abnormality is noted. IMPRESSION: No acute fracture seen. Radiopaque foreign body adjacent to the posterior aspect of the second MCP joint as described. Correlate with the clinical history. Electronically Signed   By: Alcide Clever M.D.   On: 05/18/2020 17:46    Procedures Procedures   Medications Ordered in ED Medications  ibuprofen (ADVIL) tablet 600 mg (600 mg Oral Given 05/18/20 1850)    ED Course  I have reviewed the triage vital signs and the nursing notes.  Pertinent labs & imaging results that were available during my care of the patient were reviewed by me and considered in my medical decision making (see chart for details).    MDM Rules/Calculators/A&P                         25 year old male presents with continued hand pain 3 weeks after striking his right hand with a hammer over the second MCP joint, did not get evaluated initially.  Has some tenderness, minimal swelling, normal sensation and cap refill.  Patient does have difficulty with range of motion, and in particular has worsened pain with full extension of the finger.  X-ray with no  evidence of fracture or significant bony abnormality, but there is a radiopaque foreign body adjacent to the posterior aspect of the second MCP.  Patient is unsure if he had a wound initially or had metal over the area, but given that this is where he is having pain I suspect the foreign body  is contributing.  There are no signs of infections and I cannot reliably feel the foreign body this is projecting over the attachment point through the extensor tendon and therefore I feel like this would be better addressed with hand surgery.  Patient is in agreement with this plan and aware that he has a foreign body in his hand.  Will refer to Dr. Orlan Leavens with hand surgery for follow-up and further management.  Patient expresses understanding and agreement.  Discharged home in good condition.  Final Clinical Impression(s) / ED Diagnoses Final diagnoses:  Injury of right hand, initial encounter    Rx / DC Orders ED Discharge Orders    None       Legrand Rams 05/18/20 1929    Gwyneth Sprout, MD 05/19/20 1530

## 2020-05-18 NOTE — ED Triage Notes (Signed)
Pt states he hit his R hand with a hammer 3 weeks ago and has had pain when he tries to move it since then.

## 2020-05-18 NOTE — Discharge Instructions (Signed)
Your x-ray shows a small piece of metal laying over the finger joint that you hit with a hammer.  This appears to be healing and there are no signs of infection.  I am concerned this could be affecting the tendon and causing your continued pain.  Please call to schedule close follow-up with Dr. Orlan Leavens with hand surgery.  Use ibuprofen 600 mg every 6 hours and Tylenol 1000 mg every 6 hours as needed for pain.

## 2020-07-09 ENCOUNTER — Telehealth: Payer: Self-pay

## 2020-07-09 ENCOUNTER — Other Ambulatory Visit: Payer: Self-pay

## 2020-07-09 ENCOUNTER — Ambulatory Visit (INDEPENDENT_AMBULATORY_CARE_PROVIDER_SITE_OTHER): Payer: Self-pay | Admitting: Gastroenterology

## 2020-07-09 ENCOUNTER — Encounter: Payer: Self-pay | Admitting: Gastroenterology

## 2020-07-09 VITALS — BP 118/72 | HR 56 | Ht 66.0 in | Wt 172.0 lb

## 2020-07-09 DIAGNOSIS — K5900 Constipation, unspecified: Secondary | ICD-10-CM

## 2020-07-09 DIAGNOSIS — K649 Unspecified hemorrhoids: Secondary | ICD-10-CM

## 2020-07-09 NOTE — Progress Notes (Signed)
Chief Complaint:    Symptomatic Internal Hemorrhoids; Hemorrhoid Band Ligation  GI History: 25 year old male with history of chronic constipation and symptomatic hemorrhoids (intermittent hematochezia, rectal itching/irritation).  Separately, history of GERD with erosive esophagitis diagnosed on EGD in 09/2019.  Treated with Protonix 40 mg bid.  He has since discontinued PPI without return of reflux symptoms.   Endoscopic History: - EGD (09/2019, Dr. Barron Alvine): LA Grade C esophagitis, gastric inlet patches, moderate non-H. pylori gastritis - Colonoscopy (09/2019, Dr. Barron Alvine): Normal colon, grade 3 hemorrhoids.  Normal TI  HPI:     Patient is a 25 y.o. malewith a history of symptomatic internal hemorrhoids presenting to [facility] for follow-up and ongoing treatment.  Initially seen by me on 09/03/2019, and completed EGD/colonoscopy on 09/12/2019 as outlined above.  No follow-up since then.  Today, he c/o continued constipation despite using MiraLAX and OTC stool softener.  Additionally, continued symptomatic grade 3 hemorrhoids, unresponsive to maximal medical therapy, requesting rubber band ligation of symptomatic hemorrhoidal disease.  No change in medical or surgical history, medications, allergies, social history since last appointment with me.  Labs reviewed from 06/15/2020 with normal CBC, CMP   Review of systems:     No chest pain, no SOB, no fevers, no urinary sx   Past Medical History:  Diagnosis Date   No known health problems     Patient's surgical history, family medical history, social history, medications and allergies were all reviewed in Epic    No current outpatient medications on file.   Current Facility-Administered Medications  Medication Dose Route Frequency Provider Last Rate Last Admin   0.9 %  sodium chloride infusion  500 mL Intravenous Once Ludger Bones V, DO        Physical Exam:     BP 118/72   Pulse (!) 56   Ht 5\' 6"   (1.676 m)   Wt 172 lb (78 kg)   SpO2 98%   BMI 27.76 kg/m   GENERAL:  Pleasant male in NAD PSYCH: : Cooperative, normal affect Musculoskeletal:  Normal muscle tone, normal strength NEURO: Alert and oriented x 3, no focal neurologic deficits Rectal exam: Sensation intact and preserved anal wink.  Grade 2-3 hemorrhoids noted in all positions on anoscopy.  No external anal fissures noted. Normal sphincter tone. No palpable mass. No blood on the exam glove. (Chaperone: , CMA).   IMPRESSION and PLAN:    #1.  Symptomatic internal hemorrhoids: PROCEDURE NOTE: The patient presents with symptomatic grade 2-3 hemorrhoids, unresponsive to maximal medical therapy, requesting rubber band ligation of symptomatic hemorrhoidal disease.  All risks, benefits and alternative forms of therapy were described and informed consent was obtained.  In the Left Lateral Decubitus position, anoscopic examination revealed grade 2-3 hemorrhoids in the all position(s).  The anorectum was pre-medicated with [facility]. The decision was made to band the LL internal hemorrhoid, and the [facility] System was used to perform band ligation without complication.  Digital anorectal examination was then performed to assure proper positioning of the band, and to adjust the banded tissue as required.  The patient was discharged home without pain or other issues.  Dietary and behavioral recommendations were given and along with follow-up instructions.     The patient will return after ARM for follow-up and possible additional banding as required. No complications were encountered and the patient tolerated the procedure well.     #2.  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         Shellia Cleverly ,DO, FACG 07/09/2020, 8:31 AM

## 2020-07-09 NOTE — Patient Instructions (Addendum)
If you are age 26 or older, your body mass index should be between 23-30. Your Body mass index is 27.76 kg/m. If this is out of the aforementioned range listed, please consider follow up with your Primary Care Provider.  If you are age 75 or younger, your body mass index should be between 19-25. Your Body mass index is 27.76 kg/m. If this is out of the aformentioned range listed, please consider follow up with your Primary Care Provider.   __________________________________________________________  The [facility] providers would like to encourage you to use [facility] to communicate with providers for non-urgent requests or questions.  Due to long hold times on the telephone, sending your provider a message by [facility] may be a faster and more efficient way to get a response.  Please allow 48 business hours for a response.  Please remember that this is for non-urgent requests.   We will call to let you know regarding your anal rectal manometry and 2nd hemorrhoid banding. Please call in 2 weeks if you haven't heard anything from [place].  HEMORRHOID BANDING PROCEDURE    FOLLOW-UP CARE   The procedure you have had should have been relatively painless since the banding of the area involved does not have nerve endings and there is no pain sensation.  The rubber band cuts off the blood supply to the hemorrhoid and the band may fall off as soon as 48 hours after the banding (the band may occasionally be seen in the toilet bowl following a bowel movement). You may notice a temporary feeling of fullness in the rectum which should respond adequately to plain Tylenol or Motrin.  Following the banding, avoid strenuous exercise that evening and resume full activity the next day.  A sitz bath (soaking in a warm tub) or bidet is soothing, and can be useful for cleansing the area after bowel movements.     To avoid constipation, take two tablespoons of natural wheat bran, natural oat bran, flax, Benefiber or any  over the counter fiber supplement and increase your water intake to 7-8 glasses daily.    Unless you have been prescribed anorectal medication, do not put anything inside your rectum for two weeks: No suppositories, enemas, fingers, etc.  Occasionally, you may have more bleeding than usual after the banding procedure.  This is often from the untreated hemorrhoids rather than the treated one.  Don't be concerned if there is a tablespoon or so of blood.  If there is more blood than this, lie flat with your bottom higher than your head and apply an ice pack to the area. If the bleeding does not stop within a half an hour or if you feel faint, call our office at [phone] or go to the emergency room.  Problems are not common; however, if there is a substantial amount of bleeding, severe pain, chills, fever or difficulty passing urine (very rare) or other problems, you should call us at [phone] or report to the nearest emergency room.  Do not stay seated continuously for more than 2-3 hours for a day or two after the procedure.  Tighten your buttock muscles 10-15 times every two hours and take 10-15 deep breaths every 1-2 hours.  Do not spend more than a few minutes on the toilet if you cannot empty your bowel; instead re-visit the toilet at a later time.  ___________________________________________________________  Bonita Quin have been scheduled to have an anorectal manometry at [facility] on 07/24/2020 at 12:30pm. Please  arrive 30 minutes prior to your appointment time for registration (1st floor of the hospital-admissions).  Please make certain to use 1 Fleets enema 2 hours prior to coming for your appointment. You can purchase Fleets enemas from the laxative section at your drug store. You should not eat anything during the two hours prior to the procedure. You may take regular medications with small sips of water at least 2 hours prior to the study.  Anorectal manometry is a test  performed to evaluate patients with constipation or fecal incontinence. This test measures the pressures of the anal sphincter muscles, the sensation in the rectum, and the neural reflexes that are needed for normal bowel movements.  THE PROCEDURE The test takes approximately 30 minutes to 1 hour. You will be asked to change into a hospital gown. A technician or nurse will explain the procedure to you, take a brief health history, and answer any questions you may have. The patient then lies on his or her left side. A small, flexible tube, about the size of a thermometer, with a balloon at the end is inserted into the rectum. The catheter is connected to a machine that measures the pressure. During the test, the small balloon attached to the catheter may be inflated in the rectum to assess the normal reflex pathways. The nurse or technician may also ask the person to squeeze, relax, and push at various times. The anal sphincter muscle pressures are measured during each of these maneuvers. To squeeze, the patient tightens the sphincter muscles as if trying to prevent anything from coming out. To push or bear down, the patient strains down as if trying to have a bowel movement.    It was a pleasure to see you today!  Vito Cirigliano, D.O.

## 2020-07-09 NOTE — Telephone Encounter (Signed)
Called wife and patient regarding anorectal manometry at [facility] on 6-24 arrival time at 12pm but couldn't leave voicemail due to voicemail box not being set up on both phones.    You have been scheduled to have an anorectal manometry at [facility] on 07/24/2020 at 12:30pm. Please arrive 30 minutes prior to your appointment time for registration (1st floor of the hospital-admissions).  Please make certain to use 1 Fleets enema 2 hours prior to coming for your appointment. You can purchase Fleets enemas from the laxative section at your drug store. You should not eat anything during the two hours prior to the procedure. You may take regular medications with small sips of water at least 2 hours prior to the study.   Anorectal manometry is a test performed to evaluate patients with constipation or fecal incontinence. This test measures the pressures of the anal sphincter muscles, the sensation in the rectum, and the neural reflexes that are needed for normal bowel movements.   THE PROCEDURE The test takes approximately 30 minutes to 1 hour. You will be asked to change into a hospital gown. A technician or nurse will explain the procedure to you, take a brief health history, and answer any questions you may have. The patient then lies on his or her left side. A small, flexible tube, about the size of a thermometer, with a balloon at the end is inserted into the rectum. The catheter is connected to a machine that measures the pressure. During the test, the small balloon attached to the catheter may be inflated in the rectum to assess the normal reflex pathways. The nurse or technician may also ask the person to squeeze, relax, and push at various times. The anal sphincter muscle pressures are measured during each of these maneuvers. To squeeze, the patient tightens the sphincter muscles as if trying to prevent anything from coming out. To push or bear down, the patient strains down as if trying to have a  bowel movement.

## 2020-07-24 ENCOUNTER — Encounter (HOSPITAL_COMMUNITY): Payer: Self-pay | Admitting: Gastroenterology

## 2020-07-24 ENCOUNTER — Ambulatory Visit (HOSPITAL_COMMUNITY)
Admission: RE | Admit: 2020-07-24 | Discharge: 2020-07-24 | Disposition: A | Discharge status: Home / Self Care | Payer: Self-pay | Attending: Gastroenterology | Admitting: Gastroenterology

## 2020-07-24 ENCOUNTER — Encounter (HOSPITAL_COMMUNITY)
Admission: RE | Disposition: A | Discharge status: Home / Self Care | Payer: Self-pay | Source: Home / Self Care | Attending: Gastroenterology

## 2020-07-24 DIAGNOSIS — K5904 Chronic idiopathic constipation: Secondary | ICD-10-CM

## 2020-07-24 DIAGNOSIS — K5909 Other constipation: Secondary | ICD-10-CM

## 2020-07-24 DIAGNOSIS — K5900 Constipation, unspecified: Secondary | ICD-10-CM | POA: Insufficient documentation

## 2020-07-24 HISTORY — PX: ANAL RECTAL MANOMETRY: SHX6358

## 2020-07-24 SURGERY — MANOMETRY, ANORECTAL

## 2020-07-24 NOTE — Discharge Instructions (Signed)
      [facility] END[facility]COPY [address] [place], [place]  [address] Phone:  [phone]   July 24, 2020  Patient: Brad [name]  Date of Birth: December 18, 1995  Date of Visit: July 24, 2020    To Whom It May Concern:  [name] was seen and treated on July 24, 2020 and may return to work on his next scheduled workday.           If you have any questions or concerns, please don't hesitate to call.   Sincerely,       Treatment Team:  Attending Provider: Shellia Cleverly, DO

## 2020-07-24 NOTE — Progress Notes (Signed)
AR performed per protocol.  Patient tolerated well.  Balloon expulsion test performed with patient expelling balloon at 15 seconds.  Report sent to Dr Marsa Aris,

## 2020-07-30 ENCOUNTER — Encounter (HOSPITAL_COMMUNITY): Payer: Self-pay | Admitting: Gastroenterology

## 2020-08-03 DIAGNOSIS — K5909 Other constipation: Secondary | ICD-10-CM

## 2020-08-06 ENCOUNTER — Telehealth: Payer: Self-pay

## 2020-08-06 NOTE — Telephone Encounter (Signed)
Is it ok if I go ahead and schedule the patient's 2nd hemorrhoid banding since he has had the anorectal manometry done?  Patient's wife is aware of the results

## 2020-08-06 NOTE — Telephone Encounter (Signed)
-----   Message from [place] V, DO sent at 08/05/2020  3:09 PM EDT ----- Anorectal Manometry completed and notable for the following: - Normal internal/external anal sphincter pressures - Rectal hyposensitivity secondary to chronic constipation - No evidence of pelvic floor dyssynergia  Recommend continue with MiraLAX 1 cap/day and titrate to soft stools without straining to have BM.  If still chronic constipation, consider increase MiraLAX to 1 cap bid or we can change to either Amitiza or Linzess.  Continue adequate hydration with at least 64 ounces of water per day.  ----- Message ----- From: Viviana Simpler, RN Sent: 08/05/2020   3:08 PM EDT To: Shellia Cleverly, DO  Anal manometry results scanned

## 2020-08-06 NOTE — Telephone Encounter (Signed)
Cant leave a voicemail because mailbox hasn't been set up

## 2020-08-06 NOTE — Telephone Encounter (Signed)
Scheduled 7-20 at 1040 with patient's wife and they are aware to come to [facility]

## 2020-08-17 ENCOUNTER — Ambulatory Visit (INDEPENDENT_AMBULATORY_CARE_PROVIDER_SITE_OTHER): Payer: Self-pay | Admitting: Family Medicine

## 2020-08-17 ENCOUNTER — Encounter: Payer: Self-pay | Admitting: Family Medicine

## 2020-08-17 ENCOUNTER — Other Ambulatory Visit: Payer: Self-pay

## 2020-08-17 VITALS — BP 122/82 | HR 91 | Temp 98.3°F | Ht 65.0 in | Wt 174.0 lb

## 2020-08-17 DIAGNOSIS — F341 Dysthymic disorder: Secondary | ICD-10-CM

## 2020-08-17 DIAGNOSIS — S46812A Strain of other muscles, fascia and tendons at shoulder and upper arm level, left arm, initial encounter: Secondary | ICD-10-CM

## 2020-08-17 DIAGNOSIS — S29011A Strain of muscle and tendon of front wall of thorax, initial encounter: Secondary | ICD-10-CM

## 2020-08-17 MED ORDER — MELOXICAM 15 MG PO TABS: 15.0000 mg | ORAL_TABLET | Freq: Every day | ORAL | 0 refills | Status: DC

## 2020-08-17 MED ORDER — SERTRALINE HCL 50 MG PO TABS: 50.0000 mg | ORAL_TABLET | Freq: Every day | ORAL | 3 refills | Status: DC

## 2020-08-17 NOTE — Patient Instructions (Addendum)
Heat (pad or rice pillow in microwave) over affected area, 10-15 minutes twice daily.   Ice/cold pack over area for 10-15 min twice daily.  OK to take Tylenol 1000 mg (2 extra strength tabs) or 975 mg (3 regular strength tabs) every 6 hours as needed.  Send me a message or call if we are not turning the corner in the next mo or so.   Let us know if you need anything.  Trapezius stretches/exercises Do exercises exactly as told by your health care provider and adjust them as directed. It is normal to feel mild stretching, pulling, tightness, or discomfort as you do these exercises, but you should stop right away if you feel sudden pain or your pain gets worse.   Stretching and range of motion exercises These exercises warm up your muscles and joints and improve the movement and flexibility of your shoulder. These exercises can also help to relieve pain, numbness, and tingling. If you are unable to do any of the following for any reason, do not further attempt to do it.   Exercise A: Flexion, standing     Stand and hold a broomstick, a cane, or a similar object. Place your hands a little more than shoulder-width apart on the object. Your left / right hand should be palm-up, and your other hand should be palm-down. Push the stick to raise your left / right arm out to your side and then over your head. Use your other hand to help move the stick. Stop when you feel a stretch in your shoulder, or when you reach the angle that is recommended by your health care provider. Avoid shrugging your shoulder while you raise your arm. Keep your shoulder blade tucked down toward your spine. Hold for 30 seconds. Slowly return to the starting position. Repeat 2 times. Complete this exercise 3 times per week.  Exercise B: Abduction, supine     Lie on your back and hold a broomstick, a cane, or a similar object. Place your hands a little more than shoulder-width apart on the object. Your left / right hand  should be palm-up, and your other hand should be palm-down. Push the stick to raise your left / right arm out to your side and then over your head. Use your other hand to help move the stick. Stop when you feel a stretch in your shoulder, or when you reach the angle that is recommended by your health care provider. Avoid shrugging your shoulder while you raise your arm. Keep your shoulder blade tucked down toward your spine. Hold for 30 seconds. Slowly return to the starting position. Repeat 2 times. Complete this exercise 3 times per week.  Exercise C: Flexion, active-assisted     Lie on your back. You may bend your knees for comfort. Hold a broomstick, a cane, or a similar object. Place your hands about shoulder-width apart on the object. Your palms should face toward your feet. Raise the stick and move your arms over your head and behind your head, toward the floor. Use your healthy arm to help your left / right arm move farther. Stop when you feel a gentle stretch in your shoulder, or when you reach the angle where your health care provider tells you to stop. Hold for 30 seconds. Slowly return to the starting position. Repeat 2 times. Complete this exercise 3 times per week.  Exercise D: External rotation and abduction     Stand in a door frame with one of your  feet slightly in front of the other. This is called a staggered stance. Choose one of the following positions as told by your health care provider: Place your hands and forearms on the door frame above your head. Place your hands and forearms on the door frame at the height of your head. Place your hands on the door frame at the height of your elbows. Slowly move your weight onto your front foot until you feel a stretch across your chest and in the front of your shoulders. Keep your head and chest upright and keep your abdominal muscles tight. Hold for 30 seconds. To release the stretch, shift your weight to your back  foot. Repeat 2 times. Complete this stretch 3 times per week.  Strengthening exercises These exercises build strength and endurance in your shoulder. Endurance is the ability to use your muscles for a long time, even after your muscles get tired. Exercise E: Scapular depression and adduction  Sit on a stable chair. Support your arms in front of you with pillows, armrests, or a tabletop. Keep your elbows in line with the sides of your body. Gently move your shoulder blades down toward your middle back. Relax the muscles on the tops of your shoulders and in the back of your neck. Hold for 3 seconds. Slowly release the tension and relax your muscles completely before doing this exercise again. Repeat for a total of 10 repetitions. After you have practiced this exercise, try doing the exercise without the arm support. Then, try the exercise while standing instead of sitting. Repeat 2 times. Complete this exercise 3 times per week.  Exercise F: Shoulder abduction, isometric     Stand or sit about 4-6 inches (10-15 cm) from a wall with your left / right side facing the wall. Bend your left / right elbow and gently press your elbow against the wall. Increase the pressure slowly until you are pressing as hard as you can without shrugging your shoulder. Hold for 3 seconds. Slowly release the tension and relax your muscles completely. Repeat for a total of 10 repetitions. Repeat 2 times. Complete this exercise 3 times per week.  Exercise G: Shoulder flexion, isometric     Stand or sit about 4-6 inches (10-15 cm) away from a wall with your left / right side facing the wall. Keep your left / right elbow straight and gently press the top of your fist against the wall. Increase the pressure slowly until you are pressing as hard as you can without shrugging your shoulder. Hold for 10-15 seconds. Slowly release the tension and relax your muscles completely. Repeat for a total of 10 repetitions. Repeat  2 times. Complete this exercise 3 times per week.  Exercise H: Internal rotation     Sit in a stable chair without armrests, or stand. Secure an exercise band at your left / right side, at elbow height. Place a soft object, such as a folded towel or a small pillow, under your left / right upper arm so your elbow is a few inches (about 8 cm) away from your side. Hold the end of the exercise band so the band stretches. Keeping your elbow pressed against the soft object under your arm, move your forearm across your body toward your abdomen. Keep your body steady so the movement is only coming from your shoulder. Hold for 3 seconds. Slowly return to the starting position. Repeat for a total of 10 repetitions. Repeat 2 times. Complete this exercise 3 times per  week.  Exercise I: External rotation     Sit in a stable chair without armrests, or stand. Secure an exercise band at your left / right side, at elbow height. Place a soft object, such as a folded towel or a small pillow, under your left / right upper arm so your elbow is a few inches (about 8 cm) away from your side. Hold the end of the exercise band so the band stretches. Keeping your elbow pressed against the soft object under your arm, move your forearm out, away from your abdomen. Keep your body steady so the movement is only coming from your shoulder. Hold for 3 seconds. Slowly return to the starting position. Repeat for a total of 10 repetitions. Repeat 2 times. Complete this exercise 3 times per week. Exercise J: Shoulder extension  Sit in a stable chair without armrests, or stand. Secure an exercise band to a stable object in front of you so the band is at shoulder height. Hold one end of the exercise band in each hand. Your palms should face each other. Straighten your elbows and lift your hands up to shoulder height. Step back, away from the secured end of the exercise band, until the band stretches. Squeeze your shoulder  blades together and pull your hands down to the sides of your thighs. Stop when your hands are straight down by your sides. Do not let your hands go behind your body. Hold for 3 seconds. Slowly return to the starting position. Repeat for a total of 10 repetitions. Repeat 2 times. Complete this exercise 3 times per week.  Exercise K: Shoulder extension, prone     Lie on your abdomen on a firm surface so your left / right arm hangs over the edge. Hold a 5 lb weight in your hand so your palm faces in toward your body. Your arm should be straight. Squeeze your shoulder blade down toward the middle of your back. Slowly raise your arm behind you, up to the height of the surface that you are lying on. Keep your arm straight. Hold for 3 seconds. Slowly return to the starting position and relax your muscles. Repeat for a total of 10 repetitions. Repeat 2 times. Complete this exercise 3 times per week.   Exercise L: Horizontal abduction, prone  Lie on your abdomen on a firm surface so your left / right arm hangs over the edge. Hold a 5 lb weight in your hand so your palm faces toward your feet. Your arm should be straight. Squeeze your shoulder blade down toward the middle of your back. Bend your elbow so your hand moves up, until your elbow is bent to an "L" shape (90 degrees). With your elbow bent, slowly move your forearm forward and up. Raise your hand up to the height of the surface that you are lying on. Your upper arm should not move, and your elbow should stay bent. At the top of the movement, your palm should face the floor. Hold for 3 seconds. Slowly return to the starting position and relax your muscles. Repeat for a total of 10 repetitions. Repeat 2 times. Complete this exercise 3 times per week.  Exercise M: Horizontal abduction, standing  Sit on a stable chair, or stand. Secure an exercise band to a stable object in front of you so the band is at shoulder height. Hold one end of the  exercise band in each hand. Straighten your elbows and lift your hands straight in front of you,  up to shoulder height. Your palms should face down, toward the floor. Step back, away from the secured end of the exercise band, until the band stretches. Move your arms out to your sides, and keep your arms straight. Hold for 3 seconds. Slowly return to the starting position. Repeat for a total of 10 repetitions. Repeat 2 times. Complete this exercise 3 times per week.  Exercise N: Scapular retraction and elevation  Sit on a stable chair, or stand. Secure an exercise band to a stable object in front of you so the band is at shoulder height. Hold one end of the exercise band in each hand. Your palms should face each other. Sit in a stable chair without armrests, or stand. Step back, away from the secured end of the exercise band, until the band stretches. Squeeze your shoulder blades together and lift your hands over your head. Keep your elbows straight. Hold for 3 seconds. Slowly return to the starting position. Repeat for a total of 10 repetitions. Repeat 2 times. Complete this exercise 3 times per week.  This information is not intended to replace advice given to you by your health care provider. Make sure you discuss any questions you have with your health care provider. Document Released: 01/17/2005 Document Revised: 09/24/2015 Document Reviewed: 12/04/2014 Elsevier Interactive Patient Education  2017 [employer].

## 2020-08-17 NOTE — Progress Notes (Signed)
Musculoskeletal Exam  Patient: Brad [name] DOB: 01/14/1996  DOS: 08/17/2020  SUBJECTIVE:  Chief Complaint:   Chief Complaint  Patient presents with   Shoulder Pain    Left shoulder pain     Brad [name] is a 25 y.o.  male for evaluation and treatment of L shoulder pain. Here w GF who helps interpret.   Onset:  3 weeks ago. No inj or change in activity.  Location: L trap Character:  aching and shooting  Progression of issue:  has worsened Associated symptoms: radiates down L arm Denies: Bruising, swelling, redness Treatment: to date has been OTC NSAIDS, acetaminophen, and muscle relaxers.   Neurovascular symptoms: no  Depressed mood Patient reports sometimes feeling down and feeling like he does not want to be here he thinks of family and friends are passed away.  He is unsure if it is affecting his daily life.  He has not been on medication before. He does not follow w a counselor or psychologist.   Past Medical History:  Diagnosis Date   No known health problems     Objective: VITAL SIGNS: BP 122/82   Pulse 91   Temp 98.3 F (36.8 C) (Oral)   Ht 5\' 5"  (1.651 m)   Wt 174 lb (78.9 kg)   SpO2 96%   BMI 28.96 kg/m  Constitutional: Well formed, well developed. No acute distress. Thorax & Lungs: No accessory muscle use Musculoskeletal: L shoulder.   Normal active range of motion: yes.   Normal passive range of motion: yes Tenderness to palpation: yes over trap and prox trap Deformity: no Ecchymosis: no Tests positive: none Tests negative: Spurling's, Neer's, cross over, Speed's, Hawkins, Obrien's Neurologic: Normal sensory function. No focal deficits noted. DTR's equal and symmetric in UE's. No clonus. Psychiatric: Normal mood. Age appropriate judgment and insight. Alert & oriented x 3.    Assessment:  Strain of left trapezius muscle, initial encounter - Plan: meloxicam (MOBIC) 15 MG tablet  Pectoralis muscle strain, initial encounter -  Plan: meloxicam (MOBIC) 15 MG tablet  Dysthymia - Plan: sertraline (ZOLOFT) 50 MG tablet  Plan: Stretches/exercises, heat, ice, Tylenol. Meloxicam for pain prn. PT if no better in 1 mo.  Dysthymia- New, uncertain prog. Will have him start Zoloft 25 mg/d for 2 weeks and then increase to 50 mg/d. F/u in 6 weeks.  The patient and his GF voiced understanding and agreement to the plan.   [place], DO 08/17/20  12:03 PM

## 2020-08-19 ENCOUNTER — Ambulatory Visit (INDEPENDENT_AMBULATORY_CARE_PROVIDER_SITE_OTHER): Payer: Self-pay | Admitting: Gastroenterology

## 2020-08-19 ENCOUNTER — Other Ambulatory Visit: Payer: Self-pay

## 2020-08-19 ENCOUNTER — Encounter: Payer: Self-pay | Admitting: Gastroenterology

## 2020-08-19 VITALS — BP 126/74 | HR 63 | Ht 65.0 in | Wt 173.5 lb

## 2020-08-19 DIAGNOSIS — K642 Third degree hemorrhoids: Secondary | ICD-10-CM

## 2020-08-19 NOTE — Patient Instructions (Signed)
If you are age 26 or older, your body mass index should be between 23-30. Your Body mass index is 28.87 kg/m. If this is out of the aforementioned range listed, please consider follow up with your Primary Care Provider.  If you are age 53 or younger, your body mass index should be between 19-25. Your Body mass index is 28.87 kg/m. If this is out of the aformentioned range listed, please consider follow up with your Primary Care Provider.   HEMORRHOID BANDING PROCEDURE    FOLLOW-UP CARE   The procedure you have had should have been relatively painless since the banding of the area involved does not have nerve endings and there is no pain sensation.  The rubber band cuts off the blood supply to the hemorrhoid and the band may fall off as soon as 48 hours after the banding (the band may occasionally be seen in the toilet bowl following a bowel movement). You may notice a temporary feeling of fullness in the rectum which should respond adequately to plain Tylenol or Motrin.  Following the banding, avoid strenuous exercise that evening and resume full activity the next day.  A sitz bath (soaking in a warm tub) or bidet is soothing, and can be useful for cleansing the area after bowel movements.     To avoid constipation, take two tablespoons of natural wheat bran, natural oat bran, flax, Benefiber or any over the counter fiber supplement and increase your water intake to 7-8 glasses daily.    Unless you have been prescribed anorectal medication, do not put anything inside your rectum for two weeks: No suppositories, enemas, fingers, etc.  Occasionally, you may have more bleeding than usual after the banding procedure.  This is often from the untreated hemorrhoids rather than the treated one.  Don't be concerned if there is a tablespoon or so of blood.  If there is more blood than this, lie flat with your bottom higher than your head and apply an ice pack to the area. If the bleeding does not stop  within a half an hour or if you feel faint, call our office at [phone] or go to the emergency room.  Problems are not common; however, if there is a substantial amount of bleeding, severe pain, chills, fever or difficulty passing urine (very rare) or other problems, you should call us at [phone] or report to the nearest emergency room.  Do not stay seated continuously for more than 2-3 hours for a day or two after the procedure.  Tighten your buttock muscles 10-15 times every two hours and take 10-15 deep breaths every 1-2 hours.  Do not spend more than a few minutes on the toilet if you cannot empty your bowel; instead re-visit the toilet at a later time.    Thank you for choosing me and  Gastroenterology.  Vito Cirigliano, D.O.

## 2020-08-19 NOTE — Progress Notes (Signed)
Chief Complaint:    Symptomatic Internal Hemorrhoids; Hemorrhoid Band Ligation  GI History: 25 year old male with history of chronic constipation and symptomatic hemorrhoids (intermittent hematochezia, rectal itching/irritation). - 07/09/2020: Successful banding of the LL hemorrhoid - 07/2020: ARM: Rectal hyposensitivity, but otherwise normal sphincter.  No pelvic floor dyssynergia - 08/19/2020: Presents for hemorrhoid banding #2   Separately, history of GERD with erosive esophagitis diagnosed on EGD in 09/2019.  Treated with Protonix 40 mg bid.  He has since discontinued PPI without return of reflux symptoms.    Endoscopic History: - EGD (09/2019, Dr. Barron Alvine): LA Grade C esophagitis, gastric inlet patches, moderate non-H. pylori gastritis - Colonoscopy (09/2019, Dr. Barron Alvine): Normal colon, grade 3 hemorrhoids.  Normal TI  HPI:     Patient is a 25 y.o. malewith a history of symptomatic internal hemorrhoids presenting to the [facility]astroenterology Clinic for follow-up and ongoing treatment. The patient presents with symptomatic grade 2 hemorrhoids, unresponsive to maximal medical therapy, requesting rubber band ligation of symptomatic hemorrhoidal disease.  No change in medical or surgical history, medications, allergies, social history since last appointment with me.  Bowel habits improving with MiraLAX 1 cap/day.  Has also increased water intake.   Review of systems:     No chest pain, no SOB, no fevers, no urinary sx   Past Medical History:  Diagnosis Date   No known health problems     Patient's surgical history, family medical history, social history, medications and allergies were all reviewed in Epic    Current Outpatient Medications  Medication Sig Dispense Refill   meloxicam (MOBIC) 15 MG tablet Take 1 tablet (15 mg total) by mouth daily. 30 tablet 0   sertraline (ZOLOFT) 50 MG tablet Take 1 tablet (50 mg total) by mouth daily. Take 1/2 tab daily for first 2 weeks. 30 tablet  3   Current Facility-Administered Medications  Medication Dose Route Frequency Provider Last Rate Last Admin   0.9 %  sodium chloride infusion  500 mL Intravenous Once Jamori Biggar V, DO        Physical Exam:     BP 126/74   Pulse 63   Ht 5\' 5"  (1.651 m)   Wt 173 lb 8 oz (78.7 kg)   SpO2 98%   BMI 28.87 kg/m   GENERAL:  Pleasant male in NAD PSYCH: : Cooperative, normal affect NEURO: Alert and oriented x 3, no focal neurologic deficits Rectal exam: Sensation intact and preserved anal wink.  Grade 2 hemorrhoids noted in RA/RP positions on exam.  No external anal fissures noted. Normal sphincter tone. No palpable mass. No blood on the exam glove. (Chaperone: , CMA).   IMPRESSION and PLAN:    #1.  Symptomatic internal hemorrhoids: PROCEDURE NOTE: The patient presents with symptomatic grade 2 hemorrhoids, unresponsive to maximal medical therapy, requesting rubber band ligation of symptomatic hemorrhoidal disease.  All risks, benefits and alternative forms of therapy were described and informed consent was obtained.  In the Left Lateral Decubitus position, anoscopic examination revealed grade 2 hemorrhoids in the RP/RA position(s).  The anorectum was pre-medicated with RectiCare. The decision was made to band the RP internal hemorrhoid, and the [facility] System was used to perform band ligation without complication.  Digital anorectal examination was then performed to assure proper positioning of the band, and to adjust the banded tissue as required.  The patient was discharged home without pain or other issues.  Dietary and behavioral recommendations were given and along with follow-up instructions.  The following adjunctive treatments were recommended:  -Resume high-fiber diet with fiber supplement (i.e. Citrucel or Benefiber) with goal for soft stools without straining to have a BM. -Resume adequate fluid intake.  The patient will return in 4 for  follow-up  and possible additional banding as required. No complications were encountered and the patient tolerated the procedure well.      #2.  Chronic constipation - Continue MiraLAX 1 cap/day and titrate to soft stools - ARM was unremarkable aside from hypersensitivity 2/2 chronic constipation - Continue increasing daily water consumption       Verlin Dike Caliya Narine ,DO, FACG 08/19/2020, 10:56 AM

## 2020-09-12 ENCOUNTER — Other Ambulatory Visit: Payer: Self-pay | Admitting: Family Medicine

## 2020-09-12 DIAGNOSIS — S46812A Strain of other muscles, fascia and tendons at shoulder and upper arm level, left arm, initial encounter: Secondary | ICD-10-CM

## 2020-09-12 DIAGNOSIS — S29011A Strain of muscle and tendon of front wall of thorax, initial encounter: Secondary | ICD-10-CM

## 2020-09-23 ENCOUNTER — Other Ambulatory Visit: Payer: Self-pay

## 2020-09-23 ENCOUNTER — Ambulatory Visit (INDEPENDENT_AMBULATORY_CARE_PROVIDER_SITE_OTHER): Payer: Self-pay | Admitting: Gastroenterology

## 2020-09-23 ENCOUNTER — Telehealth: Payer: Self-pay | Admitting: Gastroenterology

## 2020-09-23 ENCOUNTER — Encounter: Payer: Self-pay | Admitting: Gastroenterology

## 2020-09-23 VITALS — BP 130/78 | HR 70 | Ht 65.0 in

## 2020-09-23 DIAGNOSIS — K649 Unspecified hemorrhoids: Secondary | ICD-10-CM

## 2020-09-23 NOTE — Telephone Encounter (Signed)
Patients wife called to let us know that the patients banding came off at about 3:20 pm today they are seeking advise.

## 2020-09-23 NOTE — Progress Notes (Signed)
Chief Complaint:    Symptomatic Internal Hemorrhoids; Hemorrhoid Band Ligation  GI History: 25 year old [name] with history of chronic constipation and symptomatic hemorrhoids (intermittent hematochezia, rectal itching/irritation). - 07/2020: ARM: Rectal hyposensitivity, but otherwise normal sphincter.  No pelvic floor dyssynergia  - 07/09/2020: Successful banding of the LL hemorrhoid - 08/19/2020: Successful banding of the RP hemorrhoid -09/23/2020: Presents for hemorrhoid banding #3    Separately, history of GERD with erosive esophagitis diagnosed on EGD in 09/2019.  Treated with Protonix 40 mg bid.  He has since discontinued PPI without return of reflux symptoms.    Endoscopic History: - EGD (09/2019, Dr. Barron Alvine): LA Grade C esophagitis, gastric inlet patches, moderate non-H. pylori gastritis - Colonoscopy (09/2019, Dr. Barron Alvine): Normal colon, grade 3 hemorrhoids.  Normal TI  HPI:     Patient is a [age] y.o. malewith a history of symptomatic internal hemorrhoids presenting to the Gastroenterology Clinic for follow-up and ongoing treatment. The patient presents with symptomatic grade 2 hemorrhoids, unresponsive to maximal medical therapy, requesting rubber band ligation of symptomatic hemorrhoidal disease.  No change in medical or surgical history, medications, allergies, social history since last appointment with me.  Has done well with each of the first 2 hemorrhoid banding series.  Scant BRBPR last week x1 day.  Otherwise good clinical response so far.  Constipation well controlled with MiraLAX and increase water intake.   Review of systems:     No chest pain, no SOB, no fevers, no urinary sx   Past Medical History:  Diagnosis Date   No known health problems     Patient's surgical history, family medical history, social history, medications and allergies were all reviewed in Epic    Current Outpatient Medications  Medication Sig Dispense Refill   meloxicam (MOBIC) 15 MG tablet  Take 1 tablet by mouth once daily 30 tablet 0   sertraline (ZOLOFT) 50 MG tablet Take 1 tablet (50 mg total) by mouth daily. Take 1/2 tab daily for first 2 weeks. 30 tablet 3   Current Facility-Administered Medications  Medication Dose Route Frequency Provider Last Rate Last Admin   0.9 %  sodium chloride infusion  500 mL Intravenous Once Arad Burston V, DO        Physical Exam:     BP 130/78   Pulse 70   Ht 5\' 5"  (1.651 m)   SpO2 99%   BMI 28.87 kg/m   GENERAL:  Pleasant [name] in NAD PSYCH: : Cooperative, normal affect NEURO: Alert and oriented x 3, no focal neurologic deficits Rectal exam: Sensation intact and preserved anal wink.  Grade 2 hemorrhoids noted in all positions on anoscopy.  No external anal fissures noted. Normal sphincter tone. No palpable mass. No blood on the exam glove. (Chaperone: , CMA).   IMPRESSION and PLAN:    #1.  Symptomatic internal hemorrhoids: PROCEDURE NOTE: The patient presents with symptomatic grade 2 hemorrhoids, unresponsive to maximal medical therapy, requesting rubber band ligation of symptomatic hemorrhoidal disease.  All risks, benefits and alternative forms of therapy were described and informed consent was obtained.  In the Left Lateral Decubitus position, anoscopic examination revealed grade 2 hemorrhoids in the RA position(s).  The anorectum was pre-medicated with RectiCare. The decision was made to band the RA internal hemorrhoid, and the [facility] System was used to perform band ligation without complication.  Digital anorectal examination was then performed to assure proper positioning of the band, and to adjust the banded tissue as required.  The patient was  discharged home without pain or other issues.  Dietary and behavioral recommendations were given and along with follow-up instructions.     The following adjunctive treatments were recommended:  -Resume high-fiber diet with fiber supplement (i.e. Citrucel or  Benefiber) with goal for soft stools without straining to have a BM. -Resume adequate fluid intake.  The patient will return as needed for  follow-up and possible additional banding as required if recurrence of hemorrhoidal symptoms. No complications were encountered and the patient tolerated the procedure well.      #2.  Chronic constipation - Continue MiraLAX 1 cap/day and titrate to soft stools - ARM was unremarkable aside from hypersensitivity 2/2 chronic constipation - Continue increasing daily water consumption      Brad [name] Brad [name] ,DO, FACG 09/23/2020, 12:05 PM

## 2020-09-23 NOTE — Patient Instructions (Signed)
If you are age 26 or older, your body mass index should be between 23-30. Your Body mass index is 28.87 kg/m. If this is out of the aforementioned range listed, please consider follow up with your Primary Care Provider.  If you are age 60 or younger, your body mass index should be between 19-25. Your Body mass index is 28.87 kg/m. If this is out of the aformentioned range listed, please consider follow up with your Primary Care Provider.   __________________________________________________________  The [facility] providers would like to encourage you to use [facility] to communicate with providers for non-urgent requests or questions.  Due to long hold times on the telephone, sending your provider a message by [facility] may be a faster and more efficient way to get a response.  Please allow 48 business hours for a response.  Please remember that this is for non-urgent requests.   Follow up as needed.  It was a pleasure to see you today!  Vito Cirigliano, D.O.

## 2020-09-24 NOTE — Telephone Encounter (Signed)
Spoke with patient, he is aware.

## 2020-09-24 NOTE — Telephone Encounter (Signed)
Spoke with patient, he states that yesterday he felt a little pain. He denies any pain today, he states that he feels good right now. Denies any bleeding, SOB or dizziness. Would you like patient to return? Please advise, thanks

## 2020-09-24 NOTE — Telephone Encounter (Signed)
No specific follow-up needed at this time as he otherwise feels well.  Plan to follow clinically and can return to the office if having active issues.

## 2020-09-30 ENCOUNTER — Other Ambulatory Visit: Payer: Self-pay

## 2020-09-30 ENCOUNTER — Ambulatory Visit (INDEPENDENT_AMBULATORY_CARE_PROVIDER_SITE_OTHER): Payer: Self-pay | Admitting: Family Medicine

## 2020-09-30 ENCOUNTER — Encounter: Payer: Self-pay | Admitting: Family Medicine

## 2020-09-30 VITALS — BP 120/72 | HR 94 | Temp 98.0°F | Ht 65.0 in | Wt 182.2 lb

## 2020-09-30 DIAGNOSIS — F341 Dysthymic disorder: Secondary | ICD-10-CM

## 2020-09-30 DIAGNOSIS — S46812A Strain of other muscles, fascia and tendons at shoulder and upper arm level, left arm, initial encounter: Secondary | ICD-10-CM

## 2020-09-30 MED ORDER — SERTRALINE HCL 50 MG PO TABS: 50.0000 mg | ORAL_TABLET | Freq: Every day | ORAL | 2 refills | Status: DC

## 2020-09-30 MED ORDER — NAPROXEN 500 MG PO TABS: 500.0000 mg | ORAL_TABLET | Freq: Two times a day (BID) | ORAL | 0 refills | Status: DC

## 2020-09-30 NOTE — Progress Notes (Signed)
Chief Complaint  Patient presents with   Follow-up    6 weeks Neck pain and depression    Subjective Brad [name] presents for f/u dysthymia.  Pt is currently being treated with Zoloft 50 mg/d.  Reports doing well since treatment. No thoughts of harming self or others. No self-medication with alcohol, prescription drugs or illicit drugs. Pt is not following with a counselor/psychologist.  Trapezius strain- Going on for over 2 mo now. L trap strain dx'd 6 weeks ago, NSAIDs, Tylenol, ice/heat, stretches/exercises were a little helpful. He was compliant wit the tx plan. No new inj or change in activity. No neuro s/s's.   Past Medical History:  Diagnosis Date   No known health problems    Allergies as of 09/30/2020   No Known Allergies      Medication List        Accurate as of September 30, 2020 11:40 AM. If you have any questions, ask your nurse or doctor.          STOP taking these medications    meloxicam 15 MG tablet Commonly known as: MOBIC Stopped by: Sharlene Dory, DO       TAKE these medications    naproxen 500 MG tablet Commonly known as: Naprosyn Take 1 tablet (500 mg total) by mouth 2 (two) times daily with a meal. Started by: Sharlene Dory, DO   sertraline 50 MG tablet Commonly known as: ZOLOFT Take 1 tablet (50 mg total) by mouth daily. What changed: additional instructions Changed by: Sharlene Dory, DO        Exam BP 120/72   Pulse 94   Temp 98 F (36.7 C) (Oral)   Ht 5\' 5"  (1.651 m)   Wt 182 lb 4 oz (82.7 kg)   SpO2 99%   BMI 30.33 kg/m  General:  well developed, well nourished, in no apparent distress MSK: No tenderness palpation of the left trapezius.  No cervical paraspinal musculature tenderness or midline tenderness.  No deformity. Neuro: Negative Spurling's bilaterally, DTRs equal and symmetric in upper extremities, no clonus, no cerebellar signs Lungs:  No respiratory distress Psych:  well oriented with normal range of affect and age-appropriate judgement/insight, alert and oriented x4.  Assessment and Plan  Dysthymia - Plan: sertraline (ZOLOFT) 50 MG tablet  Strain of left trapezius muscle, initial encounter  Chronic, stable. Cont Zoloft 50 mg/d.  Chronic, uncontrolled.  Cont home stretches/exercises. Change Mobic to Naproxen. Cont Tylenol, heat/ice. Let me know if no better and we will set up with physical therapy. I will see him in 3 months for his physical or as needed. The patient voiced understanding and agreement to the plan.  Ben Wheeler, DO 09/30/20 11:40 AM

## 2020-09-30 NOTE — Patient Instructions (Signed)
Heat (pad or rice pillow in microwave) over affected area, 10-15 minutes twice daily.   Ice/cold pack over area for 10-15 min twice daily.  OK to take Tylenol 1000 mg (2 extra strength tabs) or 975 mg (3 regular strength tabs) every 6 hours as needed.  Stop the meloxicam, take naproxen instead.  Continue the stretches for the shoulder in the meanwhile.

## 2020-11-10 ENCOUNTER — Ambulatory Visit (INDEPENDENT_AMBULATORY_CARE_PROVIDER_SITE_OTHER): Payer: Self-pay | Admitting: Gastroenterology

## 2020-11-10 ENCOUNTER — Other Ambulatory Visit: Payer: Self-pay

## 2020-11-10 ENCOUNTER — Encounter: Payer: Self-pay | Admitting: Gastroenterology

## 2020-11-10 VITALS — BP 122/76 | HR 75 | Ht 65.0 in | Wt 186.4 lb

## 2020-11-10 DIAGNOSIS — K5900 Constipation, unspecified: Secondary | ICD-10-CM

## 2020-11-10 DIAGNOSIS — K649 Unspecified hemorrhoids: Secondary | ICD-10-CM

## 2020-11-10 DIAGNOSIS — K921 Melena: Secondary | ICD-10-CM

## 2020-11-10 NOTE — Progress Notes (Signed)
    Chief Complaint:    Hematochezia, rectal discomfort, symptomatic Internal Hemorrhoids  GI History: 25 year old male with history of chronic constipation and symptomatic hemorrhoids (intermittent hematochezia, rectal itching/irritation). - 07/2020: ARM: Rectal hyposensitivity, but otherwise normal sphincter.  No pelvic floor dyssynergia  - 07/09/2020: Successful banding of the LL hemorrhoid - 08/19/2020: Successful banding of the RP hemorrhoid - 09/23/2020: Successful banding of the RA hemorrhoid     Separately, history of GERD with erosive esophagitis diagnosed on EGD in 09/2019.  Treated with Protonix 40 mg bid.  He has since discontinued PPI without return of reflux symptoms.    Endoscopic History: - EGD (09/2019, Dr. Barron Alvine): LA Grade C esophagitis, gastric inlet patches, moderate non-H. pylori gastritis - Colonoscopy (09/2019, Dr. Barron Alvine): Normal colon, grade 3 hemorrhoids.  Normal TI  HPI:     Patient is a 25 y.o. male presenting to GI clinic for follow-up and evaluation of BRBPR recurrence.  He has a history of symptomatic internal hemorrhoids, last seen in the GI clinic on 09/23/2020 for hemorrhoid banding as outlined above.  Had been doing well since then, but recurrence of hemorrhoidal symptoms 5 days ago, lasting 3 days.  Described as BRB on tissue paper and in toilet water. Last episode was 2 days ago, described as scant BRB on tissue paper.   He has otherwise been in his usual state of health since last appointment.  No new labs or imaging for review today. No other change in medical or surgical history, medications, allergies, social history since last appointment with me.  Spanish interpreter in the room to assist in communication, although speaks/understands English quite well.   Review of systems:     No chest pain, no SOB, no fevers, no urinary sx   Past Medical History:  Diagnosis Date   No known health problems     Patient's surgical history, family medical  history, social history, medications and allergies were all reviewed in Epic    Current Outpatient Medications  Medication Sig Dispense Refill   naproxen (NAPROSYN) 500 MG tablet Take 1 tablet (500 mg total) by mouth 2 (two) times daily with a meal. 60 tablet 0   sertraline (ZOLOFT) 50 MG tablet Take 1 tablet (50 mg total) by mouth daily. 90 tablet 2   No current facility-administered medications for this visit.    Physical Exam:     BP 122/76   Pulse 75   Ht 5\' 5"  (1.651 m)   Wt 186 lb 6 oz (84.5 kg)   SpO2 98%   BMI 31.01 kg/m   GENERAL:  Pleasant male in NAD PSYCH: : Cooperative, normal affect NEURO: Alert and oriented x 3, no focal neurologic deficits Rectal: Exam deferred by patient    IMPRESSION and PLAN:    #1.  Symptomatic internal hemorrhoids: - Offered rectal exam with the possibility of repeat hemorrhoid banding, but he politely declines in favor of more conservative management as his symptoms are improving - Trial course of Preparation H suppository, sitz bath, conservative management - Continue MiraLAX to keep stools soft as below - If no significant improvement, will return to the office for evaluation and likely repeat hemorrhoid banding   #2.  Chronic constipation - Continue MiraLAX 1 cap/day and titrate to soft stools - ARM was unremarkable aside from hypersensitivity 2/2 chronic constipation - Continue adequate daily water consumption      Brad [name] V Brad [name] ,DO, FACG 11/10/2020, 1:50 PM

## 2020-11-10 NOTE — Patient Instructions (Signed)
If you are age 26 or older, your body mass index should be between 23-30. Your Body mass index is 31.01 kg/m. If this is out of the aforementioned range listed, please consider follow up with your Primary Care Provider.  If you are age 84 or younger, your body mass index should be between 19-25. Your Body mass index is 31.01 kg/m. If this is out of the aformentioned range listed, please consider follow up with your Primary Care Provider.   __________________________________________________________  The [facility] providers would like to encourage you to use [facility] to communicate with providers for non-urgent requests or questions.  Due to long hold times on the telephone, sending your provider a message by [facility] may be a faster and more efficient way to get a response.  Please allow 48 business hours for a response.  Please remember that this is for non-urgent requests.  ___________________________________________________________  Please purchase the following medications over the counter and take as directed:  Preparation H suppositories use daily for 7 days  Follow up as needed.  Due to recent changes in healthcare laws, you may see the results of your imaging and laboratory studies on MyChart before your provider has had a chance to review them.  We understand that in some cases there may be results that are confusing or concerning to you. Not all laboratory results come back in the same time frame and the provider may be waiting for multiple results in order to interpret others.  Please give [place] 48 hours in order for your provider to thoroughly review all the results before contacting the office for clarification of your results.   Thank you for choosing me and [facility].  Vito Cirigliano, D.O.

## 2020-12-29 ENCOUNTER — Other Ambulatory Visit: Payer: Self-pay

## 2020-12-30 ENCOUNTER — Encounter: Payer: Self-pay | Admitting: Family Medicine

## 2020-12-30 ENCOUNTER — Ambulatory Visit (INDEPENDENT_AMBULATORY_CARE_PROVIDER_SITE_OTHER): Payer: Self-pay | Admitting: Family Medicine

## 2020-12-30 VITALS — BP 102/68 | HR 74 | Temp 98.4°F | Ht 66.0 in | Wt 183.4 lb

## 2020-12-30 DIAGNOSIS — Z23 Encounter for immunization: Secondary | ICD-10-CM

## 2020-12-30 DIAGNOSIS — Z114 Encounter for screening for human immunodeficiency virus [HIV]: Secondary | ICD-10-CM

## 2020-12-30 DIAGNOSIS — M79605 Pain in left leg: Secondary | ICD-10-CM

## 2020-12-30 DIAGNOSIS — Z0000 Encounter for general adult medical examination without abnormal findings: Secondary | ICD-10-CM

## 2020-12-30 DIAGNOSIS — Z1159 Encounter for screening for other viral diseases: Secondary | ICD-10-CM

## 2020-12-30 DIAGNOSIS — R1084 Generalized abdominal pain: Secondary | ICD-10-CM | POA: Insufficient documentation

## 2020-12-30 DIAGNOSIS — K5909 Other constipation: Secondary | ICD-10-CM

## 2020-12-30 MED ORDER — MELOXICAM 15 MG PO TABS: 15.0000 mg | ORAL_TABLET | Freq: Every day | ORAL | 0 refills | Status: DC

## 2020-12-30 NOTE — Progress Notes (Signed)
Chief Complaint  Patient presents with   Follow-up    Discuss depression medication    Well Male Brad [name] is here for a complete physical.   His last physical was >1 year ago.  Current diet: in general, diet could be better.    Current exercise: walking Weight trend: increased a little  Fatigue out of ordinary? No. Seat belt? Yes.    Health maintenance Tetanus- No HIV- No Hep C- No  L leg pain 3 weeks ago, no inj or change in activity. Radiates to the top of the foot. Comes and goes. No redness, bruising, swelling, neuro s/s's. Achy/throbbing pain. Not getting better. He has associated back pain. He does not stretch routinely.   Chronic abd pain/constipation This has been going on for 4 years. He has intermittent loose stool and constipation. Bloating also, seems to get better when mood is better. Feels he is doing well with the Zoloft 50 mg/d, does not wish to change it. He is following with GI. No bleeding, wt loss, nighttime awakenings, fevers.   Past Medical History:  Diagnosis Date   No known health problems      Past Surgical History:  Procedure Laterality Date   ANAL RECTAL MANOMETRY N/A 07/24/2020   Procedure: ANO RECTAL MANOMETRY;  Surgeon: Shellia Cleverly, DO;  Location: [facility];  Service: Gastroenterology;  Laterality: N/A;   NO PAST SURGERIES     OTHER SURGICAL HISTORY     On Penis    Medications  Current Outpatient Medications on File Prior to Visit  Medication Sig Dispense Refill   naproxen (NAPROSYN) 500 MG tablet Take 1 tablet (500 mg total) by mouth 2 (two) times daily with a meal. 60 tablet 0   sertraline (ZOLOFT) 50 MG tablet Take 1 tablet (50 mg total) by mouth daily. 90 tablet 2   Allergies No Known Allergies  Family History Family History  Problem Relation Age of Onset   Diabetes Mother    Diabetes Maternal Grandmother    Diabetes Maternal Grandfather    Cancer Maternal Grandfather    Prostate cancer Maternal  Grandfather    Colon cancer Neg Hx    Esophageal cancer Neg Hx    Rectal cancer Neg Hx    Stomach cancer Neg Hx     Review of Systems: Constitutional: no fevers or chills Eye:  no recent significant change in vision Ear/Nose/Mouth/Throat:  Ears:  no hearing loss Nose/Mouth/Throat:  no complaints of nasal congestion, no sore throat Cardiovascular:  no chest pain Respiratory:  no shortness of breath Gastrointestinal:  as noted in HPI GU:  Male: negative for dysuria Musculoskeletal/Extremities: +LLE pain Integumentary (Skin/Breast):  no abnormal skin lesions reported Neurologic:  no headaches Endocrine: No unexpected weight changes Hematologic/Lymphatic:  no night sweats  Exam BP 102/68   Pulse 74   Temp 98.4 F (36.9 C) (Oral)   Ht 5\' 6"  (1.676 m)   Wt 183 lb 6 oz (83.2 kg)   SpO2 99%   BMI 29.60 kg/m  General:  well developed, well nourished, in no apparent distress Skin:  no significant moles, warts, or growths Head:  no masses, lesions, or tenderness Eyes:  pupils equal and round, sclera anicteric without injection Ears:  canals without lesions, TMs shiny without retraction, no obvious effusion, no erythema Nose:  nares patent, septum midline, mucosa normal Throat/Pharynx:  lips and gingiva without lesion; tongue and uvula midline; non-inflamed pharynx; no exudates or postnasal drainage Neck: neck supple without adenopathy, thyromegaly, or  masses Lungs:  clear to auscultation, breath sounds equal bilaterally, no respiratory distress Cardio:  regular rate and rhythm, no bruits, no LE edema Abdomen:  abdomen soft, nontender; bowel sounds normal; no masses or organomegaly Genital (male): Deferred Rectal: Deferred Musculoskeletal: ttp over TA on L, no pain with L foot dorsiflexion; symmetrical muscle groups noted without atrophy or deformity Extremities:  no clubbing, cyanosis, or edema, no deformities, no skin discoloration Neuro:  gait normal; deep tendon reflexes normal  and symmetric Psych: well oriented with normal range of affect and appropriate judgment/insight  Assessment and Plan  Well adult exam - Plan: CBC, Comprehensive metabolic panel, Lipid panel  Encounter for hepatitis C screening test for low risk patient - Plan: Hepatitis C antibody  Screening for HIV without presence of risk factors - Plan: HIV Antibody (routine testing w rflx)  Left leg pain - Plan: meloxicam (MOBIC) 15 MG tablet  Chronic constipation  Generalized abdominal pain   Well 25 y.o. male. Counseled on diet and exercise. Self testicular exams recommended at least monthly.  Other orders as above. TA strain: Mobic prn, Tylenol, ice, heat, stretches. PT if no better in 1 mo. Chronic constipation/abd pain: Chronic, not controlled. Add daily fiber supp. Cont Zoloft, could consider TCA transition if upcoming appt w GI not fruitful. Consider concentrated peppermint if no improvements. Stay hydrated.  Flu shot and Tdap today. Covid booster bivalent rec'd.  Consider daily fiber supplement. Stay hydrated.  Follow up in 6 mo pending the above workup. The patient voiced understanding and agreement to the plan.  Jilda Roche [place], DO 12/30/20 10:58 AM

## 2020-12-30 NOTE — Patient Instructions (Addendum)
Give [place] 2-3 business days to get the results of your labs back.   Keep the diet clean and stay active.  I recommend getting the updated bivalent covid vaccination booster at your convenience.   Take Metamucil or Benefiber daily. Consider IBGard (over the counter peppermint) to help with bloating.  For the leg, consider:  Heat (pad or rice pillow in microwave) over affected area, 10-15 minutes twice daily.   Stretch the front of your leg buy straightening your ankle and holding for 30 seconds. Turn your foot inwards while straightening and holding for 30 seconds. Repeat each 3 times and do it 3 times per week. If you are still having issues in the next month, send me a message and we will set you up with physical therapy.   Ice/cold pack over area for 10-15 min twice daily.  OK to take Tylenol 1000 mg (2 extra strength tabs) or 975 mg (3 regular strength tabs) every 6 hours as needed.  Let us know if you need anything.

## 2021-01-07 ENCOUNTER — Ambulatory Visit: Payer: Self-pay | Admitting: Gastroenterology

## 2021-01-07 ENCOUNTER — Telehealth: Payer: Self-pay

## 2021-01-07 MED ORDER — HYDROCORTISONE ACETATE 25 MG RE SUPP: 25.0000 mg | Freq: Every day | RECTAL | 1 refills | Status: DC

## 2021-01-07 NOTE — Telephone Encounter (Signed)
Hydrocortisone supp 25 mg qHS x 5 nights  RectiCare OTC per tube instructions for pain Warm sitz baths Can we seen by APP sooner if needed Will CC Dr. Barron Alvine to see if he wants to work in for banding sooner than late Jan JMP

## 2021-01-07 NOTE — Telephone Encounter (Signed)
Patient wife made aware. Supp sent in and they were told about recticare and sitz baths. They said they will call if there is any problems in the meantime.

## 2021-01-07 NOTE — Telephone Encounter (Signed)
Noted, thanks!

## 2021-01-07 NOTE — Telephone Encounter (Signed)
Dr. Rhea Belton This is a Dr Cirigliano's patient. I was told you are DOD for this morning  Patient is having bad hemorrhoid symptoms and his hemorrhoid has popped out as well per wife. Patient was suppose to be seen today but is not able to due to provider unavailable . Is there anything we can do for them? They are requesting a sooner appointment than 02-15-2021 because of his issues. Dr Barron Alvine doesn't have any more appointments in December. Please advise

## 2021-01-11 NOTE — Telephone Encounter (Signed)
Thank you for taking care of him in my recent absence.  Agree with expedited overbook appointment with me tomorrow.  Thank you.

## 2021-01-11 NOTE — Telephone Encounter (Signed)
Scheduled for 12-13 at 1:40pm. Knows to come at 130pm

## 2021-01-12 ENCOUNTER — Encounter: Payer: Self-pay | Admitting: Gastroenterology

## 2021-01-12 ENCOUNTER — Ambulatory Visit (INDEPENDENT_AMBULATORY_CARE_PROVIDER_SITE_OTHER): Payer: Self-pay | Admitting: Gastroenterology

## 2021-01-12 ENCOUNTER — Other Ambulatory Visit: Payer: Self-pay

## 2021-01-12 VITALS — BP 130/80 | HR 82 | Ht 66.0 in | Wt 182.0 lb

## 2021-01-12 DIAGNOSIS — K642 Third degree hemorrhoids: Secondary | ICD-10-CM

## 2021-01-12 NOTE — Progress Notes (Signed)
Chief Complaint:    Symptomatic Internal Hemorrhoids; Hemorrhoid Band Ligation  GI History: 25 year old male with history of chronic constipation and symptomatic hemorrhoids (intermittent hematochezia, rectal itching/irritation). - 07/2020: ARM: Rectal hyposensitivity, but otherwise normal sphincter.  No pelvic floor dyssynergia  - 07/09/2020: Successful banding of the LL hemorrhoid - 08/19/2020: Successful banding of the RP hemorrhoid - 09/23/2020: Successful banding of the RA hemorrhoid     Separately, history of GERD with erosive esophagitis diagnosed on EGD in 09/2019.  Treated with Protonix 40 mg bid.  He has since discontinued PPI without return of reflux symptoms.    Endoscopic History: - EGD (09/2019, Dr. Barron Alvine): LA Grade C esophagitis, gastric inlet patches, moderate non-H. pylori gastritis - Colonoscopy (09/2019, Dr. Barron Alvine): Normal colon, grade 3 hemorrhoids.  Normal TI  HPI:     Patient is a 25 y.o. male with a history of symptomatic internal hemorrhoids presenting to the [facility]ology [facility] for follow-up and ongoing treatment.  Was last seen in the GI clinic on 11/10/2020 for symptomatic recurrence of hemorrhoids described as BRBPR.  Symptoms were improving prior to appointment and patient declined rectal exam.  Was instead treated with conservative management with Preparation H suppository, sitz bath, MiraLAX for soft stools and recommended follow-up in the office if recurrence.  Today, he states hemorrhoidal symptoms started to recur ~2 weeks ago, described as BRBPR x1, then recurrence last week x3 days.  Called into the office 12/8 and was treated with hydrocortisone suppository x5 nights, RectiCare OTC, sitz bath with expedited appointment today.   He states BRB has stopped over last 2 days or so, but would like evaluation for repeat hemorrhoid banding. Also reports an episodic left sided abdominal cramping that traveled down to top of left foot. Was seen by his PCM  for this 11/30. Not present now. Having regular bowel habits.   No change in medical or surgical history, medications, allergies, social history since last appointment with me.    Spanish interpreter in the room to assist in communication, although speaks/understands English quite well.   Review of systems:     No chest pain, no SOB, no fevers, no urinary sx   Past Medical History:  Diagnosis Date   No known health problems     Patient's surgical history, family medical history, social history, medications and allergies were all reviewed in Epic    Current Outpatient Medications  Medication Sig Dispense Refill   hydrocortisone (ANUSOL-HC) 25 MG suppository Place 1 suppository (25 mg total) rectally at bedtime. For 5 nights 5 suppository 1   meloxicam (MOBIC) 15 MG tablet Take 1 tablet (15 mg total) by mouth daily. 30 tablet 0   sertraline (ZOLOFT) 50 MG tablet Take 1 tablet (50 mg total) by mouth daily. 90 tablet 2   No current facility-administered medications for this visit.    Physical Exam:     BP 130/80    Pulse 82    Ht 5\' 6"  (1.676 m)    Wt 182 lb (82.6 kg)    SpO2 99%    BMI 29.38 kg/m   GENERAL:  Pleasant male in NAD PSYCH: : Cooperative, normal affect ABDOMEN:  Nondistended, soft, nontender SKIN:  turgor, no lesions seen Musculoskeletal:  Normal muscle tone, normal strength NEURO: Alert and oriented x 3, no focal neurologic deficits Rectal exam: Sensation intact and preserved anal wink.  Grade 2 hemorrhoid in LL position and grade 1 hemorrhoid in RP position on anoscopy.  No external anal fissures noted. Normal  sphincter tone. No palpable mass. No blood on the exam glove. (Chaperone: Latricia Heft, CMA).   IMPRESSION and PLAN:    #1.  Symptomatic internal hemorrhoids: PROCEDURE NOTE: The patient presents with symptomatic grade 1-2 hemorrhoids, unresponsive to maximal medical therapy, requesting rubber band ligation of symptomatic hemorrhoidal disease.  All  risks, benefits and alternative forms of therapy were described and informed consent was obtained.  In the Left Lateral Decubitus position, anoscopic examination revealed grade 2 hemorrhoids in the LL position and grade 1 hemorrhoid in the RP position.  The anorectum was pre-medicated with RectiCare. The decision was made to band both the LL and RP internal hemorrhoids, and the [facility] ORegan System was used to perform band ligation without complication.  Digital anorectal examination was then performed to assure proper positioning of the band, and to adjust the banded tissue as required.  The patient was discharged home without pain or other issues.  Dietary and behavioral recommendations were given and along with follow-up instructions.     The following adjunctive treatments were recommended:  -Resume high-fiber diet with fiber supplement (i.e. Citrucel or Benefiber) with goal for soft stools without straining to have a BM. -Resume adequate fluid intake.  The patient will return as needed for  follow-up and possible additional banding as required. No complications were encountered and the patient tolerated the procedure well.     #2.  Chronic constipation - Continue MiraLAX 1 cap/day and titrate to soft stools - ARM was unremarkable aside from hypersensitivity 2/2 chronic constipation - Continue adequate daily water consumption  Verlin Dike Kjirsten Bloodgood ,DO, FACG 01/12/2021, 2:09 PM

## 2021-01-12 NOTE — Patient Instructions (Signed)
If you are age 26 or older, your body mass index should be between 23-30. Your Body mass index is 29.38 kg/m. If this is out of the aforementioned range listed, please consider follow up with your Primary Care Provider.  If you are age 15 or younger, your body mass index should be between 19-25. Your Body mass index is 29.38 kg/m. If this is out of the aformentioned range listed, please consider follow up with your Primary Care Provider.   __________________________________________________________  The [facility] providers would like to encourage you to use [facility] to communicate with providers for non-urgent requests or questions.  Due to long hold times on the telephone, sending your provider a message by [facility] may be a faster and more efficient way to get a response.  Please allow 48 business hours for a response.  Please remember that this is for non-urgent requests.   HEMORRHOID BANDING PROCEDURE    FOLLOW-UP CARE   The procedure you have had should have been relatively painless since the banding of the area involved does not have nerve endings and there is no pain sensation.  The rubber band cuts off the blood supply to the hemorrhoid and the band may fall off as soon as 48 hours after the banding (the band may occasionally be seen in the toilet bowl following a bowel movement). You may notice a temporary feeling of fullness in the rectum which should respond adequately to plain Tylenol or Motrin.  Following the banding, avoid strenuous exercise that evening and resume full activity the next day.  A sitz bath (soaking in a warm tub) or bidet is soothing, and can be useful for cleansing the area after bowel movements.     To avoid constipation, take two tablespoons of natural wheat bran, natural oat bran, flax, Benefiber or any over the counter fiber supplement and increase your water intake to 7-8 glasses daily.    Unless you have been prescribed anorectal medication, do not put  anything inside your rectum for two weeks: No suppositories, enemas, fingers, etc.  Occasionally, you may have more bleeding than usual after the banding procedure.  This is often from the untreated hemorrhoids rather than the treated one.  Dont be concerned if there is a tablespoon or so of blood.  If there is more blood than this, lie flat with your bottom higher than your head and apply an ice pack to the area. If the bleeding does not stop within a half an hour or if you feel faint, call our office at [phone] or go to the emergency room.  Problems are not common; however, if there is a substantial amount of bleeding, severe pain, chills, fever or difficulty passing urine (very rare) or other problems, you should call us at [phone] or report to the nearest emergency room.  Do not stay seated continuously for more than 2-3 hours for a day or two after the procedure.  Tighten your buttock muscles 10-15 times every two hours and take 10-15 deep breaths every 1-2 hours.  Do not spend more than a few minutes on the toilet if you cannot empty your bowel; instead re-visit the toilet at a later time.   Thank you for choosing me and [facility].  Vito Cirigliano, D.O.

## 2021-01-20 ENCOUNTER — Encounter: Payer: Self-pay | Admitting: Family Medicine

## 2021-01-20 ENCOUNTER — Ambulatory Visit (INDEPENDENT_AMBULATORY_CARE_PROVIDER_SITE_OTHER): Payer: Self-pay | Admitting: Family Medicine

## 2021-01-20 VITALS — BP 120/78 | HR 75 | Temp 98.5°F | Ht 66.0 in | Wt 183.1 lb

## 2021-01-20 DIAGNOSIS — Z0000 Encounter for general adult medical examination without abnormal findings: Secondary | ICD-10-CM

## 2021-01-20 DIAGNOSIS — R195 Other fecal abnormalities: Secondary | ICD-10-CM

## 2021-01-20 DIAGNOSIS — R1084 Generalized abdominal pain: Secondary | ICD-10-CM

## 2021-01-20 DIAGNOSIS — Z114 Encounter for screening for human immunodeficiency virus [HIV]: Secondary | ICD-10-CM

## 2021-01-20 DIAGNOSIS — Z1159 Encounter for screening for other viral diseases: Secondary | ICD-10-CM

## 2021-01-20 DIAGNOSIS — S46812D Strain of other muscles, fascia and tendons at shoulder and upper arm level, left arm, subsequent encounter: Secondary | ICD-10-CM

## 2021-01-20 LAB — CBC
HCT: 44.6 % (ref 39.0–52.0)
Hemoglobin: 14.8 g/dL (ref 13.0–17.0)
MCHC: 33.1 g/dL (ref 30.0–36.0)
MCV: 93 fl (ref 78.0–100.0)
Platelets: 272 10*3/uL (ref 150.0–400.0)
RBC: 4.79 Mil/uL (ref 4.22–5.81)
RDW: 12.9 % (ref 11.5–15.5)
WBC: 6.6 10*3/uL (ref 4.0–10.5)

## 2021-01-20 LAB — COMPREHENSIVE METABOLIC PANEL
ALT: 48 U/L (ref 0–53)
AST: 30 U/L (ref 0–37)
Albumin: 4.9 g/dL (ref 3.5–5.2)
Alkaline Phosphatase: 66 U/L (ref 39–117)
BUN: 14 mg/dL (ref 6–23)
CO2: 31 mEq/L (ref 19–32)
Calcium: 9.9 mg/dL (ref 8.4–10.5)
Chloride: 105 mEq/L (ref 96–112)
Creatinine, Ser: 0.93 mg/dL (ref 0.40–1.50)
GFR: 113.18 mL/min (ref >60.00)
Glucose, Bld: 93 mg/dL (ref 70–99)
Potassium: 4.4 mEq/L (ref 3.5–5.1)
Sodium: 142 mEq/L (ref 135–145)
Total Bilirubin: 0.5 mg/dL (ref 0.2–1.2)
Total Protein: 7.3 g/dL (ref 6.0–8.3)

## 2021-01-20 LAB — LIPID PANEL
Cholesterol: 189 mg/dL (ref 0–200)
HDL: 42.3 mg/dL (ref >39.00)
LDL Cholesterol: 124 mg/dL — ABNORMAL HIGH (ref 0–99)
NonHDL: 147.09
Total CHOL/HDL Ratio: 4
Triglycerides: 116 mg/dL (ref 0.0–149.0)
VLDL: 23.2 mg/dL (ref 0.0–40.0)

## 2021-01-20 NOTE — Addendum Note (Signed)
Addended by: Rosita Kea on: 01/20/2021 07:59 AM   Modules accepted: Orders

## 2021-01-20 NOTE — Addendum Note (Signed)
Addended by: Rosita Kea on: 01/20/2021 08:01 AM   Modules accepted: Orders

## 2021-01-20 NOTE — Patient Instructions (Addendum)
Stay hydrated.  We will be in touch regarding your stool testing results.  Consider a probiotic.  Stop chewing gum, drinking carbonated beverages, gulping liquids, and drinking alcohol to help with belching.  These foods may cause you to belch more: Wheat, barley, rye, onion, leek, white part of spring onion, garlic, shallots, artichokes, beetroot, fennel, peas, chicory, pistachio, cashews, legumes, lentils, and chickpeas; Milk, custard, ice cream, and yogurt; Apples, pears, mangoes, cherries, watermelon, asparagus, sugar snap peas, honey, high-fructose corn syrup; Apricots, nectarines, peaches, plums, mushrooms, cauliflower, artificially sweetened chewing gum and confectionery  Consider taking IBgard (concentrated peppermint) which can help with bloating type symptoms and sometimes pain.   If you do not hear anything about your referral in the next 1-2 weeks, call our office and ask for an update.  Let us know if you need anything.

## 2021-01-20 NOTE — Progress Notes (Signed)
Chief Complaint  Patient presents with   Annual Exam    Subjective: Patient is a 25 y.o. male here for f/u. Here w GF who helps interpretation  GI- still having lower abd pain and loose stools at least for 2 weeks. Has mucus and blood as well. Saw GI who banded some hemorrhoids a week ago. No recent travel or antibiotic use.   L leg pain- better overall. Associated with lower abd pain interestingly. He is able to bear weight with fewer issues.   He continues to have pain over his L trap region. He tried home stretches/exercises w/o relief.   Past Medical History:  Diagnosis Date   No known health problems     Objective: BP 120/78    Pulse 75    Temp 98.5 F (36.9 C) (Oral)    Ht 5\' 6"  (1.676 m)    Wt 183 lb 2 oz (83.1 kg)    SpO2 96%    BMI 29.56 kg/m  General: Awake, appears stated age Heart: RRR, no LE edema Lungs: CTAB, no rales, wheezes or rhonchi. No accessory muscle use Abd: BS+, S, TTP in epigastric region and lower quadrants, ND; neg Murphy's, McBurney's, Rovsing's, Carnett's MSK: +TTP over L trap, not over L TA Psych: Age appropriate judgment and insight, normal affect and mood  Assessment and Plan: Loose stools - Plan: GI Profile, Stool, PCR  Generalized abdominal pain  Strain of left trapezius muscle, subsequent encounter - Plan: Ambulatory referral to Physical Therapy  New dx, uncertain prog. Ck stool cx. Neg colonoscopy 8.21.  Chronic, unstable. Consider IBgard. Sounds like he is having IBS type symptoms as this could help.  Refer PT.  The patient through his GF voiced understanding and agreement to the plan.  [place], DO 01/20/21  7:41 AM

## 2021-01-21 ENCOUNTER — Other Ambulatory Visit: Payer: Self-pay

## 2021-01-21 DIAGNOSIS — R195 Other fecal abnormalities: Secondary | ICD-10-CM

## 2021-01-21 LAB — HIV ANTIBODY (ROUTINE TESTING W REFLEX): HIV 1&2 Ab, 4th Generation: NONREACTIVE

## 2021-01-21 LAB — HEPATITIS C ANTIBODY
Hepatitis C Ab: NONREACTIVE
SIGNAL TO CUT-OFF: 0.03 (ref <1.00)

## 2021-01-25 LAB — GI PROFILE, STOOL, PCR

## 2021-01-26 ENCOUNTER — Other Ambulatory Visit: Payer: Self-pay | Admitting: Family Medicine

## 2021-01-26 MED ORDER — AZITHROMYCIN 500 MG PO TABS: 500.0000 mg | ORAL_TABLET | Freq: Every day | ORAL | 0 refills | Status: AC

## 2021-02-10 ENCOUNTER — Ambulatory Visit (INDEPENDENT_AMBULATORY_CARE_PROVIDER_SITE_OTHER): Payer: Self-pay | Admitting: Family Medicine

## 2021-02-10 ENCOUNTER — Encounter: Payer: Self-pay | Admitting: Family Medicine

## 2021-02-10 ENCOUNTER — Ambulatory Visit: Payer: Self-pay | Admitting: Family Medicine

## 2021-02-10 ENCOUNTER — Telehealth: Payer: Self-pay | Admitting: Family Medicine

## 2021-02-10 VITALS — BP 128/72 | HR 82 | Temp 98.6°F | Ht 65.0 in | Wt 190.0 lb

## 2021-02-10 DIAGNOSIS — R42 Dizziness and giddiness: Secondary | ICD-10-CM

## 2021-02-10 DIAGNOSIS — A09 Infectious gastroenteritis and colitis, unspecified: Secondary | ICD-10-CM

## 2021-02-10 DIAGNOSIS — L989 Disorder of the skin and subcutaneous tissue, unspecified: Secondary | ICD-10-CM

## 2021-02-10 DIAGNOSIS — F341 Dysthymic disorder: Secondary | ICD-10-CM

## 2021-02-10 MED ORDER — SERTRALINE HCL 50 MG PO TABS: 50.0000 mg | ORAL_TABLET | Freq: Every day | ORAL | 2 refills | Status: DC

## 2021-02-10 MED ORDER — AZITHROMYCIN 500 MG PO TABS: 500.0000 mg | ORAL_TABLET | Freq: Every day | ORAL | 0 refills | Status: AC

## 2021-02-10 NOTE — Telephone Encounter (Signed)
error 

## 2021-02-10 NOTE — Patient Instructions (Addendum)
Stay hydrated. Drink Gatorade or something with electrolytes (Powerade, Pedialyte, etc) while you are having diarrhea. If the new medicine does not work, send a message Sunday or Monday.   Please reach out to Dr. Barron Alvine regarding the hemorrhoids.   Stay hydrated.   Cut down on the energy drinks and sodas.   Let us know if you need anything.

## 2021-02-10 NOTE — Progress Notes (Signed)
Chief Complaint  Patient presents with   Dizziness   Diarrhea    Subjective: Patient is a 26 y.o. male here for f/u.  Here with girlfriend who helps interpret.  This AM, was in shower, it got dark in his visual field and he fell to the ground. He was aware the entire time and did not bite his tongue or lose bowel/bladder control.  He landed on his right knee but that is not particularly bothersome.  He is no longer having any blood in his stool after hemorrhoid banding.  He denies any blood in his urine.  He has been eating a little less routinely but did not eat to resolve the issue.  The dizziness lasted for 15 minutes after he fell to the ground.  He did not hit his head or lose consciousness.  He does not drink much water and consumes lots of energy drinks and soda.  He continues to have diarrhea.  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  Patient continues to have painful nodules in his forearms.  He would like them removed if possible.  He is wondering if we need to see for this.  Past Medical History:  Diagnosis Date   No known health problems     Objective: BP 128/72    Pulse 82    Temp 98.6 F (37 C) (Oral)    Ht 5\' 5"  (1.651 m)    Wt 190 lb (86.2 kg)    SpO2 98%    BMI 31.62 kg/m  General: Awake, appears stated age Heart: RRR, no LE edema Lungs: CTAB, no rales, wheezes or rhonchi. No accessory muscle use Abdomen: Bowel sounds present, soft, nontender, nondistended Skin: Subcutaneous nodules noted on the forearms without overlying skin color changes, mild tenderness to palpation without warmth, fluctuance, or drainage.  There is no scaling. Psych: Age appropriate judgment and insight, normal affect and mood  Assessment and Plan: Diarrhea of infectious origin - Plan: azithromycin (ZITHROMAX) 500 MG tablet  Dysthymia - Plan: sertraline (ZOLOFT) 50 MG tablet  Lightheadedness  Skin lesion - Plan: Ambulatory referral to  Dermatology  Since this has not cleared yet, will send in 3 days of azithromycin to take daily, 500 mg.  Consume something with electrolytes as long as he is not having diarrhea. Refill Zoloft 50 mg daily. I think this is related to #1.  He needs to eat/drink healthier.  Does not appear to be neurogenic.  Doubt anemia.  Labs on 01/20/2021 were unremarkable. He does have some nodules in his forearms that he would like removed, we will refer to dermatology today. The patient and his girlfriend voiced understanding and agreement to the plan.  I spent 30 minutes with the patient and girlfriend discussing the above plan in addition to reviewing his chart on the same day of the visit.  01/22/2021 [place], DO 02/10/21  4:47 PM

## 2021-02-10 NOTE — Telephone Encounter (Signed)
Pt spouse contacted ov regarding husband. Spouse stated pt has been feeling dizzy and passed out in the shower. Transferred to traige.

## 2021-02-10 NOTE — Telephone Encounter (Signed)
Nurse Assessment Nurse: Leilani Merl, RN, Heather Date/Time (Eastern Time): 02/10/2021 8:14:59 AM Confirm and document reason for call. If symptomatic, describe symptoms. ---Caller states she is wanting to get an appointment for her husband . He got dizzy and passed out in the shower. He has had diarrhea for sometime now Does the patient have any new or worsening symptoms? ---Yes Will a triage be completed? ---Yes Related visit to physician within the last 2 weeks? ---No Does the PT have any chronic conditions? (i.e. diabetes, asthma, this includes High risk factors for pregnancy, etc.) ---Yes List chronic conditions. ---undiagnosed stomach problems Is this a behavioral health or substance abuse call? ---No Guidelines Guideline Title Affirmed Question Affirmed Notes Nurse Date/Time (Eastern Time) Dizziness - Lightheadedness [1] Dizziness caused by heat exposure, sudden standing, or poor fluid intake AND [2] no improvement after 2 hours of rest and fluids Standifer, RN, Nira Conn 02/10/2021 8:23:59 AM PLEASE NOTE: All timestamps contained within this report are represented as [place] Standard Time. CONFIDENTIALTY NOTICE: This fax transmission is intended only for the addressee. It contains information that is legally privileged, confidential or otherwise protected from use or disclosure. If you are not the intended recipient, you are strictly prohibited from reviewing, disclosing, copying using or disseminating any of this information or taking any action in reliance on or regarding this information. If you have received this fax in error, please notify us immediately by telephone so that we can arrange for its return to [place]. Phone: [phone], Toll-Free: [phone], Fax: [phone] Page: 2 of 2 Call Id: [id number] [place]. Time Eilene Ghazi Time) Disposition Final User 02/10/2021 8:12:37 AM Send to Urgent Queue Vinnie Level 02/10/2021 8:27:26 AM See HCP within 4 Hours (or PCP  triage) Yes Standifer, RN, Nira Conn Caller Disagree/Comply Comply Caller Understands Yes PreDisposition Call Doctor Care Advice Given Per Guideline SEE HCP (OR PCP TRIAGE) WITHIN 4 HOURS: * IF OFFICE WILL BE OPEN: You need to be seen within the next 3 or 4 hours. Call your doctor (or NP/PA) now or as soon as the office opens. CALL BACK IF: * You become worse CARE ADVICE given per Dizziness (Adult) guideline. Referrals [place] UNDECIDED

## 2021-02-10 NOTE — Telephone Encounter (Signed)
Called the patient no answer//no VM set up yet. Called the wife and scheduled at 4:15 today.

## 2021-02-15 ENCOUNTER — Ambulatory Visit: Payer: Self-pay | Admitting: Gastroenterology

## 2021-02-24 ENCOUNTER — Ambulatory Visit (INDEPENDENT_AMBULATORY_CARE_PROVIDER_SITE_OTHER): Payer: Self-pay | Admitting: Family Medicine

## 2021-02-24 ENCOUNTER — Encounter: Payer: Self-pay | Admitting: Family Medicine

## 2021-02-24 VITALS — BP 108/68 | HR 72 | Temp 98.2°F | Ht 65.0 in | Wt 194.2 lb

## 2021-02-24 DIAGNOSIS — K921 Melena: Secondary | ICD-10-CM

## 2021-02-24 LAB — CBC
HCT: 42.9 % (ref 39.0–52.0)
Hemoglobin: 14.1 g/dL (ref 13.0–17.0)
MCHC: 32.8 g/dL (ref 30.0–36.0)
MCV: 94.2 fl (ref 78.0–100.0)
Platelets: 217 10*3/uL (ref 150.0–400.0)
RBC: 4.55 Mil/uL (ref 4.22–5.81)
RDW: 13.3 % (ref 11.5–15.5)
WBC: 6.8 10*3/uL (ref 4.0–10.5)

## 2021-02-24 NOTE — Progress Notes (Signed)
Chief Complaint  Patient presents with   Follow-up    Still having some loose stool Blood in stool    Subjective: Patient is a 26 y.o. male here for f/u diarrhea. Here w GF who helps interpret (Spanish).   Pt was treated for persistent diarrhea w azithromycin after failing to clear E coli after several weeks. He is still having some issues w diarrhea and started bleeding 4 d ago. He saw some mucus this AM. Stools are becoming more solid. Had L sided abd pain earlier in week, feels fine now.  +fatigue after a bloody bowel movements. Denies fevers, injury to bottom, N/V, areas of easy bruising/bleeding.  Past Medical History:  Diagnosis Date   No known health problems     Objective: BP 108/68    Pulse 72    Temp 98.2 F (36.8 C) (Oral)    Ht 5\' 5"  (1.651 m)    Wt 194 lb 4 oz (88.1 kg)    SpO2 98%    BMI 32.32 kg/m  General: Awake, appears stated age Heart: RRR, no LE edema Lungs: CTAB, no rales, wheezes or rhonchi. No accessory muscle use Abd: BS+, S, NT, ND, no masses or organomegaly.  Rectal: No fissures or bleeding hemorrhoids Psych: Age appropriate judgment and insight, normal affect and mood  Assessment and Plan: Blood in stool - Plan: CBC  Ck CBC given reports of lightheadedness. Will have him f/u w Dr. of the GI team for further management. Unlikely to be infectious and stool caliber is normalizing.  The patient and his GF voiced understanding and agreement to the plan.  Salena Saner [place], DO 02/24/21  11:23 AM

## 2021-02-24 NOTE — Patient Instructions (Addendum)
Give [place] 2-3 business days to get the results of your labs back.   This is unlikely to be an infection. Please call Dr. Barron Alvine for follow up for this issue.   Let us know if you need anything.

## 2021-03-04 ENCOUNTER — Ambulatory Visit (INDEPENDENT_AMBULATORY_CARE_PROVIDER_SITE_OTHER): Payer: Self-pay | Admitting: Gastroenterology

## 2021-03-04 ENCOUNTER — Other Ambulatory Visit: Payer: Self-pay

## 2021-03-04 ENCOUNTER — Encounter: Payer: Self-pay | Admitting: Gastroenterology

## 2021-03-04 VITALS — BP 112/70 | HR 95 | Ht 65.0 in | Wt 191.0 lb

## 2021-03-04 DIAGNOSIS — K648 Other hemorrhoids: Secondary | ICD-10-CM

## 2021-03-04 DIAGNOSIS — K582 Mixed irritable bowel syndrome: Secondary | ICD-10-CM

## 2021-03-04 NOTE — Progress Notes (Signed)
Chief Complaint:    Internal hemorrhoids, rectal bleeding  GI History: 26 year old male with history of chronic constipation and symptomatic hemorrhoids (intermittent hematochezia, rectal itching/irritation). - 07/2020: ARM: Rectal hyposensitivity, but otherwise normal sphincter.  No pelvic floor dyssynergia  - 07/09/2020: Successful banding of the LL hemorrhoid - 08/19/2020: Successful banding of the RP hemorrhoid - 09/23/2020: Successful banding of the RA hemorrhoid - 12/2020: Recurrence of episodic BRBPR.  Treated with hydrocortisone suppository x5 nights, RectiCare OTC, sitz bath - 01/12/2021: GI follow-up.  Repeat banding of LL and RP hemorrhoids     Separately, history of GERD with erosive esophagitis diagnosed on EGD in 09/2019.  Treated with Protonix 40 mg bid.  He has since discontinued PPI without return of reflux symptoms.    Endoscopic History: - EGD (09/2019, Dr. Bryan Lemma): LA Grade C esophagitis, gastric inlet patches, moderate non-H. pylori gastritis - Colonoscopy (09/2019, Dr. Bryan Lemma): Normal colon, grade 3 hemorrhoids.  Normal TI  HPI:     Patient is a 26 y.o. male presenting to the Gastroenterology Clinic for follow-up.  Last seen by me on 01/12/2021 for evaluation of rectal bleeding.  Anoscopy with grade 2 hemorrhoid in LL position and grade 1 hemorrhoid in RP position.  Both were banded.  He was seen by [facility] on 01/20/2021 with  c/o lower abdominal pain and diarrhea.  GI PCR panel with enteropathogenic E. coli.  CBC normal.  Treated with azithromycin due to persistent LGI symptoms.  Was seen in follow-up by his PCM last month.   Today, he states he had BRBPR 2 weeks ago, lasting 3 days. This was in the setting of infectious diarrhea as above. Still with intermittent loose stools, but improving now. Does not use fiber supplements.  Does drink several energy drinks and multiple sodas per day.  Presents to the office with his significant other.  History obtained via  Spanish interpreter in the room, although he understands English quite well.  Review of systems:     No chest pain, no SOB, no fevers, no urinary sx   Past Medical History:  Diagnosis Date   No known health problems     Patient's surgical history, family medical history, social history, medications and allergies were all reviewed in Epic    Current Outpatient Medications  Medication Sig Dispense Refill   hydrocortisone (ANUSOL-HC) 25 MG suppository Place 1 suppository (25 mg total) rectally at bedtime. For 5 nights 5 suppository 1   sertraline (ZOLOFT) 50 MG tablet Take 1 tablet (50 mg total) by mouth daily. 90 tablet 2   No current facility-administered medications for this visit.    Physical Exam:     BP 112/70    Pulse 95    Ht 5\' 5"  (1.651 m)    Wt 191 lb (86.6 kg)    SpO2 98%    BMI 31.78 kg/m   GENERAL:  Pleasant male in NAD PSYCH: : Cooperative, normal affect ABDOMEN:  Nondistended, soft, nontender. No obvious masses, no hepatomegaly,  normal bowel sounds Musculoskeletal:  Normal muscle tone, normal strength NEURO: Alert and oriented x 3, no focal neurologic deficits Rectal exam: Sensation intact and preserved anal wink. No external anal fissures, external hemorrhoids or skin tags. Normal sphincter tone. No palpable mass. No blood on the exam glove.  Anoscopy with grade 1 inflamed hemorrhoid in LL position.  (Chaperone: Renee Rival, CMA).     IMPRESSION and PLAN:    1) Internal hemorrhoids Recent flare of hemorrhoid in the setting of infectious diarrhea.  Hemorrhoidal symptoms improved over the last 2 weeks or so as has LGI symptoms.  Endoscopy today with inflamed grade 1 hemorrhoid in LL position.  Discussed treatment options and recommended conservative management.  If hemorrhoid flares/bleeds again, particularly if improvement in LGI symptoms with treatment as below, plan for referral to Colorectal Surgery as he has had hemorrhoid band ligation x5  2) IBS mixed  type - Previously was constipation predominant, which improved with MiraLAX.  He is no longer taking MiraLAX and now with mixed type IBS - ARM was unremarkable aside from hypersensitivity 2/2 chronic constipation - Decrease number of energy drinks and sodas - Start high-fiber diet.  Provided with fiber chart handout and detailed instruction today - Start fiber supplement such as Citrucel or Benefiber - Start low FODMAP diet.  Provided with handout and detailed instruction today - Increase water intake  I spent 35 minutes of time, including in depth chart review, independent review of results as outlined above, communicating results with the patient directly, face-to-face time with the patient, coordinating care, and ordering studies and medications as appropriate, and documentation.   [name] ,DO, FACG 03/04/2021, 1:52 PM

## 2021-03-04 NOTE — Patient Instructions (Signed)
If you are age 27 or older, your body mass index should be between 23-30. Your Body mass index is 31.78 kg/m. If this is out of the aforementioned range listed, please consider follow up with your Primary Care Provider.  If you are age 42 or younger, your body mass index should be between 19-25. Your Body mass index is 31.78 kg/m. If this is out of the aformentioned range listed, please consider follow up with your Primary Care Provider.   __________________________________________________________  The [place] [facility] providers would like to encourage you to use [facility] to communicate with providers for non-urgent requests or questions.  Due to long hold times on the telephone, sending your provider a message by [facility] may be a faster and more efficient way to get a response.  Please allow 48 business hours for a response.  Please remember that this is for non-urgent requests.   Due to recent changes in healthcare laws, you may see the results of your imaging and laboratory studies on MyChart before your provider has had a chance to review them.  We understand that in some cases there may be results that are confusing or concerning to you. Not all laboratory results come back in the same time frame and the provider may be waiting for multiple results in order to interpret others.  Please give [place] 48 hours in order for your provider to thoroughly review all the results before contacting the office for clarification of your results.  Thank you for choosing me and [facility].  Vito Cirigliano, D.O.

## 2021-03-31 ENCOUNTER — Encounter: Payer: Self-pay | Admitting: Family Medicine

## 2021-03-31 ENCOUNTER — Ambulatory Visit (INDEPENDENT_AMBULATORY_CARE_PROVIDER_SITE_OTHER): Payer: Self-pay | Admitting: Family Medicine

## 2021-03-31 VITALS — BP 135/78 | HR 84 | Temp 98.2°F | Resp 16 | Ht 65.0 in | Wt 189.4 lb

## 2021-03-31 DIAGNOSIS — K529 Noninfective gastroenteritis and colitis, unspecified: Secondary | ICD-10-CM

## 2021-03-31 MED ORDER — ONDANSETRON 4 MG PO TBDP: 4.0000 mg | ORAL_TABLET | Freq: Three times a day (TID) | ORAL | 0 refills | Status: DC | PRN

## 2021-03-31 MED ORDER — DICYCLOMINE HCL 10 MG PO CAPS: ORAL_CAPSULE | ORAL | 0 refills | Status: DC

## 2021-03-31 NOTE — Progress Notes (Signed)
Chief Complaint  ?Patient presents with  ? Follow-up  ?  ED follow up Abdominal pain  ? ? ? ?Subjective ?Brad [name] is a 25 y.o. male who presents with vomiting and diarrhea. Here w GF who helps interpret.  ?Symptoms began 6 d ago.  ?Patient has epigastric abdominal pain, vomiting, and diarrhea ?Patient denies fever, headache, and URI symptoms ?Treatment to date: GI cocktail ?Sick contacts: none known ? ?Past Medical History:  ?Diagnosis Date  ? No known health problems   ? ? ?Exam ?BP 135/78 (BP Location: Right Arm, Patient Position: Sitting, Cuff Size: Normal)   Pulse 84   Temp 98.2 ?F (36.8 ?C) (Oral)   Resp 16   Ht 5\' 5"  (1.651 m)   Wt 189 lb 6.4 oz (85.9 kg)   SpO2 95%   BMI 31.52 kg/m?  ?General:  well developed, well hydrated, in no apparent distress ?Skin:  warm, no pallor or diaphoresis, no rashes ?Throat/Pharynx:  lips and gingiva without lesion; tongue and uvula midline; non-inflamed pharynx; no exudates or postnasal drainage ?Lungs:  clear to auscultation, breath sounds equal bilaterally, no respiratory distress, no wheezes ?Cardio:  RRR ?Abdomen:  abdomen soft, TTP in RUQ; bowel sounds normal; no masses or organomegaly ?Psych: Appropriate judgement/insight ? ?Assessment and Plan ? ?Gastroenteritis - Plan: ondansetron (ZOFRAN-ODT) 4 MG disintegrating tablet, dicyclomine (BENTYL) 10 MG capsule ? ?Take Pepcid otc. Stay hydrated. Avoid aggravating foods, discussed advancing diet. Has appt w his GI Mon. If that gets cancelled and he continues to have pain, will consider HIDA.  ?F/u as originally scheduled.  ?The patient and his GF voiced understanding and agreement to the plan. ? ?Shelda Pal, DO ?03/31/21  ?1:51 PM ? ?

## 2021-03-31 NOTE — Patient Instructions (Signed)
Take some Pepcid 20 mg twice daily for the next week.  ? ?Stay hydrated. ? ?Let us know if you need anything. ?

## 2021-04-05 ENCOUNTER — Encounter: Payer: Self-pay | Admitting: Gastroenterology

## 2021-04-05 ENCOUNTER — Other Ambulatory Visit: Payer: Self-pay

## 2021-04-05 ENCOUNTER — Ambulatory Visit (INDEPENDENT_AMBULATORY_CARE_PROVIDER_SITE_OTHER): Payer: Self-pay | Admitting: Gastroenterology

## 2021-04-05 VITALS — BP 118/70 | HR 65 | Ht 65.0 in | Wt 188.4 lb

## 2021-04-05 DIAGNOSIS — K6289 Other specified diseases of anus and rectum: Secondary | ICD-10-CM

## 2021-04-05 DIAGNOSIS — K649 Unspecified hemorrhoids: Secondary | ICD-10-CM

## 2021-04-05 DIAGNOSIS — K582 Mixed irritable bowel syndrome: Secondary | ICD-10-CM

## 2021-04-05 NOTE — Progress Notes (Signed)
? ?Chief Complaint:    Abdominal pain, rectal bleeding ? ?GI History: 26 year old male with history of chronic constipation and symptomatic hemorrhoids (intermittent hematochezia, rectal itching/irritation). ?- 07/2020: ARM: Rectal hyposensitivity, but otherwise normal sphincter.  No pelvic floor dyssynergia ? ?- 07/09/2020: Successful banding of the LL hemorrhoid ?- 08/19/2020: Successful banding of the RP hemorrhoid ?- 09/23/2020: Successful banding of the RA hemorrhoid ?- 12/2020: Recurrence of episodic BRBPR.  Treated with hydrocortisone suppository x5 nights, RectiCare OTC, sitz bath ?- 01/12/2021: GI follow-up.  Repeat banding of LL and RP hemorrhoids ?  ?Additionally, history of IBS.  Was initially constipation predominant, improved with MiraLAX.  Was then IBS mixed type and MiraLAX stopped. ?- 03/2021: Recommended low FODMAP diet, supplemental fiber, and reduce/avoid energy drinks and sodas ?  ?Separately, history of GERD with erosive esophagitis diagnosed on EGD in 09/2019.  Treated with Protonix 40 mg bid.  He has since discontinued PPI without return of reflux symptoms. ?   ?Endoscopic History: ?- EGD (09/2019, Dr. Bryan Lemma): LA Grade C esophagitis, gastric inlet patches, moderate non-H. pylori gastritis ?- Colonoscopy (09/2019, Dr. Bryan Lemma): Normal colon, grade 3 hemorrhoids.  Normal TI ? ?HPI:   ? ? ?Patient is a 26 y.o. male presenting to the Gastroenterology Clinic for ER follow-up.  ? ?Was last seen by me on 03/04/2021.  Main issue at that time was episodic BRBPR and intermittent loose stools.  Had recommended fiber supplement, low FODMAP diet, and to cut down on his several energy drinks and multiple sodas/day.  Rectal exam with grade 1 inflamed LL hemorrhoid, treated conservatively. ? ?Went to the ER on 03/22/2021 with RUQ pain x4 days.  Labs were normal to include CBC, CMP, lipase.  Normal ultrasound.  CT A/P normal and was discharged home. ? ?Had follow-up with his PCM on 03/31/2021.  Was prescribed  Zofran and Bentyl with GI follow-up. ? ?Today, states he feels fine today.  However, he is concerned about changes in bowel habits.  Can range from constipation to loose stools.  No recent hematochezia. ? ?Still with recurrent rectal pain. Occurs at random, but after BM. Lasts up to 90 mins.  Not dyschezia.  Last episode was few days ago.  Requesting referral to Colorectal Surgery. ? ?Review of systems:     No chest pain, no SOB, no fevers, no urinary sx  ? ?Past Medical History:  ?Diagnosis Date  ? No known health problems   ? ? ?Patient's surgical history, family medical history, social history, medications and allergies were all reviewed in Epic  ? ? ?Current Outpatient Medications  ?Medication Sig Dispense Refill  ? dicyclomine (BENTYL) 10 MG capsule Take 1 tab every 6 hours as needed for abdominal cramping. 60 capsule 0  ? ondansetron (ZOFRAN-ODT) 4 MG disintegrating tablet Take 1 tablet (4 mg total) by mouth every 8 (eight) hours as needed for nausea or vomiting. 20 tablet 0  ? sertraline (ZOLOFT) 50 MG tablet Take 1 tablet (50 mg total) by mouth daily. 90 tablet 2  ? ?No current facility-administered medications for this visit.  ? ? ?Physical Exam:   ? ? ?BP 118/70   Pulse 65   Ht 5\' 5"  (1.651 m)   Wt 188 lb 6 oz (85.4 kg)   SpO2 98%   BMI 31.35 kg/m?  ? ?GENERAL:  Pleasant male in NAD ?PSYCH: : Cooperative, normal affect ?Musculoskeletal:  Normal muscle tone, normal strength ?NEURO: Alert and oriented x 3, no focal neurologic deficits ? ? ?IMPRESSION and PLAN:   ? ?  1) IBS mixed type ?- Again recommended starting fiber supplement for goal of regular soft stools/straining to BM ?- Increase dietary fiber ?- Trial of Bentyl ? ?2) Rectal pain ?3) Internal hemorrhoids ?- Has undergone hemorrhoid banding x5.  No recent hematochezia but still intermittent rectal pain.  Symptoms last too long to be proctalgia fugax ?- Referral to Colorectal Surgery per patient request ?    ? ?RTC prn ? ?    ? ?[name]  ,DO, FACG 04/05/2021, 2:06 PM ? ?

## 2021-04-05 NOTE — Patient Instructions (Signed)
If you are age 27 or older, your body mass index should be between 23-30. Your Body mass index is 31.35 kg/m?Marland Kitchen If this is out of the aforementioned range listed, please consider follow up with your Primary Care Provider. ? ?If you are age 89 or younger, your body mass index should be between 19-25. Your Body mass index is 31.35 kg/m?Marland Kitchen If this is out of the aformentioned range listed, please consider follow up with your Primary Care Provider.  ? ?________________________________________________________ ? ?The [place] [facility] providers would like to encourage you to use [facility] to communicate with providers for non-urgent requests or questions.  Due to long hold times on the telephone, sending your provider a message by [facility] may be a faster and more efficient way to get a response.  Please allow 48 business hours for a response.  Please remember that this is for non-urgent requests.  ?_______________________________________________________ ? ?We are sending a referral to [facility]. If you haven't heard anything from them in 2 weeks please call [phone]. ? ?Please call with any questions or concerns. Follow up as needed. ? ?It was a pleasure to see you today! ? ?Doristine Locks, D.O. ? ?

## 2021-04-06 ENCOUNTER — Telehealth: Payer: Self-pay

## 2021-04-06 NOTE — Telephone Encounter (Signed)
Spoke to Blanca from [facility] and she was able to look this patient up in epic and she she will send a message to the referral coordinator to see about this patient getting seen since the referral couldn't go through after many tries using 2 different fax numbers ?

## 2021-04-13 NOTE — Telephone Encounter (Signed)
Pt is wanting to know if the provider could refer him to somewhere closer other than [place] since his appt is scheduled for 4/3.[facility] next available is 4/3 and pt is requesting something much sooner. ?

## 2021-04-13 NOTE — Telephone Encounter (Addendum)
I called the [facility] in [place] and that's how I spoke to [place] the first time. I never called [facility]. I didn't know there was one in [place]. The number I have for [facility] is [phone] so he would have to call them because they gave him the next available appointment from the message you sent me. He can ask about the [facility] location though. He can call that number to see about any cancellations every day and see to get a sooner appointment with them or he can research places that will do the surgery but I referred to [facility]. That's who we refer to for his symptoms ?

## 2021-05-17 NOTE — Progress Notes (Signed)
Surgery orders requested via Epic inbox. °

## 2021-05-19 NOTE — Patient Instructions (Addendum)
DUE TO COVID-19 ONLY ONE VISITOR  (aged 27 and older)  IS ALLOWED TO COME WITH YOU AND STAY IN THE WAITING ROOM ONLY DURING PRE OP AND PROCEDURE.   ?**NO VISITORS ARE ALLOWED IN THE SHORT STAY AREA OR RECOVERY ROOM!!** ? ? Your procedure is scheduled on: 06/04/21 ? ? Report to [facility] Main Entrance ? ?  Report to admitting at 12:15 PM ? ? Call this number if you have problems the morning of surgery [phone] ? ? Do not eat food :After Midnight. ? ? After Midnight you may have the following liquids until 11:30 AM DAY OF SURGERY ? ?Water ?Black Coffee (sugar ok, NO MILK/CREAM OR CREAMERS)  ?Tea (sugar ok, NO MILK/CREAM OR CREAMERS) regular and decaf                             ?Plain Jell-O (NO RED)                                           ?Fruit ices (not with fruit pulp, NO RED)                                     ?Popsicles (NO RED)                                                                  ?Juice: apple, WHITE grape, WHITE cranberry ?Sports drinks like Gatorade (NO RED) ?Clear broth(vegetable,chicken,beef) ? ?FOLLOW BOWEL PREP AND ANY ADDITIONAL PRE OP INSTRUCTIONS YOU RECEIVED FROM YOUR SURGEON'S OFFICE!!! ?  ?  ?Oral Hygiene is also important to reduce your risk of infection.                                    ?Remember - BRUSH YOUR TEETH THE MORNING OF SURGERY WITH YOUR REGULAR TOOTHPASTE ? ? Do NOT smoke after Midnight ? ? Take these medicines the morning of surgery with A SIP OF WATER: None ?                  ?           You may not have any metal on your body including jewelry, and body piercing ? ?           Do not wear lotions, powders, cologne, or deodorant ? ?            Men may shave face and neck. ? ? Do not bring valuables to the hospital. [facility] NOT ?            RESPONSIBLE   FOR VALUABLES. ? ? Contacts, dentures or bridgework may not be worn into surgery. ? ? Patients discharged on the day of surgery will not be allowed to drive home.  Someone NEEDS to stay with you for  the first 24 hours after anesthesia. ? ?            Please read  over the following fact sheets you were given: IF YOU HAVE [facility] [phone]- Apolonio Schneiders  ? ?   [facility] - Preparing for Surgery ?Before surgery, you can play an important role.  Because skin is not sterile, your skin needs to be as free of germs as possible.  You can reduce the number of germs on your skin by washing with CHG (chlorahexidine gluconate) soap before surgery.  CHG is an antiseptic cleaner which kills germs and bonds with the skin to continue killing germs even after washing. ?Please DO NOT use if you have an allergy to CHG or antibacterial soaps.  If your skin becomes reddened/irritated stop using the CHG and inform your nurse when you arrive at Short Stay. ?Do not shave (including legs and underarms) for at least 48 hours prior to the first CHG shower.  You may shave your face/neck. ? ?Please follow these instructions carefully: ? 1.  Shower with CHG Soap the night before surgery and the  morning of surgery. ? 2.  If you choose to wash your hair, wash your hair first as usual with your normal  shampoo. ? 3.  After you shampoo, rinse your hair and body thoroughly to remove the shampoo.                            ? 4.  Use CHG as you would any other liquid soap.  You can apply chg directly to the skin and wash.  Gently with a scrungie or clean washcloth. ? 5.  Apply the CHG Soap to your body ONLY FROM THE NECK DOWN.   Do   not use on face/ open      ?                     Wound or open sores. Avoid contact with eyes, ears mouth and   genitals (private parts).  ?                     Production manager,  Genitals (private parts) with your normal soap. ?            6.  Wash thoroughly, paying special attention to the area where your    surgery  will be performed. ? 7.  Thoroughly rinse your body with warm water from the neck down. ? 8.  DO NOT shower/wash with your normal soap after using and rinsing off  the CHG Soap. ?               9.  Pat yourself dry with a clean towel. ?           10.  Wear clean pajamas. ?           11.  Place clean sheets on your bed the night of your first shower and do not  sleep with pets. ?Day of Surgery : ?Do not apply any lotions/deodorants the morning of surgery.  Please wear clean clothes to the hospital/surgery center. ? ?FAILURE TO FOLLOW THESE INSTRUCTIONS MAY RESULT IN THE CANCELLATION OF YOUR SURGERY ? ?PATIENT SIGNATURE_________________________________ ? ?NURSE SIGNATURE__________________________________ ? ?________________________________________________________________________  ?

## 2021-05-19 NOTE — Progress Notes (Addendum)
COVID Vaccine Completed: yes x2 ?Date COVID Vaccine completed: 05/18/19, 06/11/19 ?Has received booster: ?COVID vaccine manufacturer: Pfizer     ? ?Date of COVID positive in last 90 days: no ? ?PCP - Arva Chafe, DO ?Cardiologist - n/a ? ?Chest x-ray - n/a ?EKG - n/a ?Stress Test - n/a ?ECHO - n/a ?Cardiac Cath - n/a ?Pacemaker/ICD device last checked: n/a ?Spinal Cord Stimulator: n/a ? ?Bowel Prep - no per pt ? ?Sleep Study - n/a ?CPAP -  ? ?Fasting Blood Sugar - n/a ?Checks Blood Sugar _____ times a day ? ?Blood Thinner Instructions: n/a ?Aspirin Instructions: ?Last Dose: ? ?Activity level: Can go up a flight of stairs and perform activities of daily living without stopping and without symptoms of chest pain or shortness of breath. ?    ? ?Anesthesia review:  ? ?Patient denies shortness of breath, fever, cough and chest pain at PAT appointment ? ? ?Patient verbalized understanding of instructions that were given to them at the PAT appointment. Patient was also instructed that they will need to review over the PAT instructions again at home before surgery.  ?

## 2021-05-21 ENCOUNTER — Encounter (HOSPITAL_COMMUNITY)
Admission: RE | Admit: 2021-05-21 | Discharge: 2021-05-21 | Disposition: A | Discharge status: Home / Self Care | Payer: Self-pay | Source: Ambulatory Visit | Attending: Surgery | Admitting: Surgery

## 2021-05-21 ENCOUNTER — Encounter (HOSPITAL_COMMUNITY): Payer: Self-pay

## 2021-05-21 VITALS — BP 128/75 | HR 69 | Temp 97.8°F | Resp 14 | Ht 65.0 in | Wt 188.3 lb

## 2021-05-21 DIAGNOSIS — Z01818 Encounter for other preprocedural examination: Secondary | ICD-10-CM

## 2021-05-21 DIAGNOSIS — Z01812 Encounter for preprocedural laboratory examination: Secondary | ICD-10-CM | POA: Insufficient documentation

## 2021-05-21 HISTORY — DX: Unspecified hemorrhoids: K64.9

## 2021-05-21 HISTORY — DX: Headache, unspecified: R51.9

## 2021-05-21 LAB — CBC
HCT: 47.7 % (ref 39.0–52.0)
Hemoglobin: 15.7 g/dL (ref 13.0–17.0)
MCH: 31.2 pg (ref 26.0–34.0)
MCHC: 32.9 g/dL (ref 30.0–36.0)
MCV: 94.8 fL (ref 80.0–100.0)
Platelets: 241 10*3/uL (ref 150–400)
RBC: 5.03 MIL/uL (ref 4.22–5.81)
RDW: 12.1 % (ref 11.5–15.5)
WBC: 7.3 10*3/uL (ref 4.0–10.5)
nRBC: 0 % (ref 0.0–0.2)

## 2021-06-02 NOTE — H&P (Signed)
REFERRING PHYSICIAN:  Doristine Locks, DO ?  ?PROVIDER:  Katha Cabal, MD ?  ?MRN: [id number] ?DOB: 12-Jun-1995 ? ?  ?Subjective  ?  ?Chief Complaint: New Consultation (Hemmhoids) ?  ?  ?  ?History of Present Illness: ?Brad [name] is a 26 y.o. male who is seen today as an office consultation at the request of Dr. Barron Alvine for evaluation of New Consultation (Hemmhoids) ?.   ?  ?Patient presents with his wife regarding his painful defecation rectal bleeding.Marland Kitchen  He has been followed by Dr. Barron Alvine and has had a colonoscopy and was found to have internal hemorrhoids.  These have been banded on several occasions in back in June July August and December 2022.  He has had recurrent bleeding per rectum from these areas.  He has problems with constipation and painful defecation and he says for several days afterwards his anus is real tight. ?  ?  ?Review of Systems: ?See HPI as well for other ROS. ?  ?ROS  ?  ?  ?Medical History: ?Past Medical History  ?    ?Past Medical History:  ?Diagnosis Date  ? Anxiety    ? Substance abuse (CMS-HCC)    ?  ?  ?  ?   ?Patient Active Problem List  ?Diagnosis  ? Strain of left trapezius muscle  ? Generalized abdominal pain  ? Foreign body in hand  ? Dysthymia  ? Chronic constipation  ?  ?  ?Past Surgical History  ?History reviewed. No pertinent surgical history.  ?  ?  ?Allergies  ?No Known Allergies  ? ?  ?      ?Current Outpatient Medications on File Prior to Visit  ?Medication Sig Dispense Refill  ? sertraline (ZOLOFT) 50 MG tablet Take 50 mg by mouth once daily      ?  ?No current facility-administered medications on file prior to visit.  ?  ?  ?Family History  ?     ?Family History  ?Problem Relation Age of Onset  ? Diabetes Mother    ? Obesity Sister    ?  ?  ?  ?Social History  ?  ?    ?Tobacco Use  ?Smoking Status Every Day  ? Types: Cigarettes  ?Smokeless Tobacco Never  ?  ?  ?Social History  ?Social History  ?  ?     ?Socioeconomic History  ? Marital status:  Married  ?Tobacco Use  ? Smoking status: Every Day  ?    Types: Cigarettes  ? Smokeless tobacco: Never  ?Vaping Use  ? Vaping Use: Never used  ?Substance and Sexual Activity  ? Alcohol use: Not Currently  ? Drug use: Yes  ?    Types: Marijuana  ?  ?  ?  ?Objective:  ?  ?  ?   ?Vitals:  ?  04/22/21 1016  ?BP: 120/80  ?Pulse: 85  ?SpO2: 95%  ?Weight: 84.4 kg (186 lb)  ?Height: 165.1 cm (5\' 5" )  ?  ?Body mass index is 30.95 kg/m?. ?  ?Physical Exam ?General: Thin well normally developed Hispanic man who is accompanied by his wife.  He speaks English somewhat but she is certainly a very helpful translator as well. ?HEENT  : Unremarkable ?Chest: Clear ?Heart: Sinus rhythm ?Breast: Not examined ?Abdomen: Nontender ?GU not examined ?Rectal external exam of the anus shows there to be on the left side a prominent hemorrhoid will shadow but no overt prolapse but the suspect is that there is a large  internal left hemorrhoid. ?Digital exam reveals a very tight anal sphincter and posteriorly he does have some tenderness that could be a fissure.  I could feel some scar tissue on the left side may be consistent with the previous banding site.  The anoscope was inserted with some difficulty because of the intensity of his internal sphincter spasm.  I was able to see some evidence of hemorrhoidal columns but there is no way I could do a banding with this exposure. ?  ?I suspect he might be best served with an exam under anesthesia, probable lateral internal sphincterotomy and examination of his hemorrhoidal columns with internal hemorrhoidectomy's as needed.  I explained this to him and his wife and he wants to move forward with this. ?Extremities full range of motion ?Neuro alert and oriented x3.  He is frustrated with doctors telling him that he is okay.  I think he does have some degree of of colonic motility affected by his painful anus and hemorrhoids. ?  ?  ?  ?  ?Labs, Imaging and Diagnostic Testing: ?None to review ?   ?Assessment and Plan:  ?Diagnoses and all orders for this visit: ?  ?Hemorrhoid prolapse ?  ?  ?  ?Symptomatic hemorrhoids, rectal bleeding, prior normal colonoscopy.  We will schedule exam under anesthesia with probable hemorrhoidectomy.  Patient has no insurance and I have advised scheduling to waive any kind of co-pays upfront and we can put him on a payment plan because he needs to have this procedure done ?  ?Seen in the preop area and procedure discussed with him and his wife.  Plan EUA and hemorrhoidectomy ?  ?Njeri Vicente Charna Busman, MD  ?  ?  ? ?

## 2021-06-04 ENCOUNTER — Other Ambulatory Visit: Payer: Self-pay

## 2021-06-04 ENCOUNTER — Encounter (HOSPITAL_COMMUNITY)
Admission: RE | Disposition: A | Discharge status: Home / Self Care | Payer: Self-pay | Source: Home / Self Care | Attending: Surgery

## 2021-06-04 ENCOUNTER — Ambulatory Visit (HOSPITAL_COMMUNITY)
Admission: RE | Admit: 2021-06-04 | Discharge: 2021-06-04 | Disposition: A | Discharge status: Home / Self Care | Payer: Self-pay | Attending: Surgery | Admitting: Surgery

## 2021-06-04 ENCOUNTER — Ambulatory Visit (HOSPITAL_BASED_OUTPATIENT_CLINIC_OR_DEPARTMENT_OTHER): Payer: Self-pay | Admitting: Anesthesiology

## 2021-06-04 ENCOUNTER — Ambulatory Visit (HOSPITAL_COMMUNITY): Payer: Self-pay | Admitting: Anesthesiology

## 2021-06-04 ENCOUNTER — Encounter (HOSPITAL_COMMUNITY): Payer: Self-pay | Admitting: Surgery

## 2021-06-04 DIAGNOSIS — F32A Depression, unspecified: Secondary | ICD-10-CM | POA: Insufficient documentation

## 2021-06-04 DIAGNOSIS — K648 Other hemorrhoids: Secondary | ICD-10-CM | POA: Insufficient documentation

## 2021-06-04 DIAGNOSIS — E669 Obesity, unspecified: Secondary | ICD-10-CM | POA: Insufficient documentation

## 2021-06-04 DIAGNOSIS — K921 Melena: Secondary | ICD-10-CM | POA: Insufficient documentation

## 2021-06-04 DIAGNOSIS — K5909 Other constipation: Secondary | ICD-10-CM | POA: Insufficient documentation

## 2021-06-04 DIAGNOSIS — Z6830 Body mass index (BMI) 30.0-30.9, adult: Secondary | ICD-10-CM | POA: Insufficient documentation

## 2021-06-04 DIAGNOSIS — F17210 Nicotine dependence, cigarettes, uncomplicated: Secondary | ICD-10-CM | POA: Insufficient documentation

## 2021-06-04 HISTORY — PX: SPHINCTEROTOMY: SHX5279

## 2021-06-04 HISTORY — PX: HEMORRHOID SURGERY: SHX153

## 2021-06-04 HISTORY — PX: RECTAL EXAM UNDER ANESTHESIA: SHX6399

## 2021-06-04 SURGERY — EXAM UNDER ANESTHESIA, RECTUM
Anesthesia: General | Site: Rectum

## 2021-06-04 MED ORDER — FENTANYL CITRATE (PF) 100 MCG/2ML IJ SOLN
INTRAMUSCULAR | Status: AC
  Filled 2021-06-04: qty 2

## 2021-06-04 MED ORDER — LACTATED RINGERS IV SOLN: INTRAVENOUS | Status: DC

## 2021-06-04 MED ORDER — CEFAZOLIN SODIUM-DEXTROSE 2-4 GM/100ML-% IV SOLN
2.0000 g | INTRAVENOUS | Status: AC
  Administered 2021-06-04: 2 g via INTRAVENOUS
  Filled 2021-06-04: qty 100

## 2021-06-04 MED ORDER — CHLORHEXIDINE GLUCONATE CLOTH 2 % EX PADS: 6.0000 | MEDICATED_PAD | Freq: Once | CUTANEOUS | Status: DC

## 2021-06-04 MED ORDER — MIDAZOLAM HCL 2 MG/2ML IJ SOLN
INTRAMUSCULAR | Status: AC
  Filled 2021-06-04: qty 2

## 2021-06-04 MED ORDER — HYDROCODONE-ACETAMINOPHEN 5-325 MG PO TABS: 1.0000 | ORAL_TABLET | Freq: Four times a day (QID) | ORAL | 0 refills | Status: DC | PRN

## 2021-06-04 MED ORDER — POVIDONE-IODINE 10 % EX OINT
TOPICAL_OINTMENT | CUTANEOUS | Status: AC
  Filled 2021-06-04: qty 28.35

## 2021-06-04 MED ORDER — PROPOFOL 10 MG/ML IV BOLUS
INTRAVENOUS | Status: AC
  Filled 2021-06-04: qty 20

## 2021-06-04 MED ORDER — OXYCODONE HCL 5 MG PO TABS: 5.0000 mg | ORAL_TABLET | Freq: Once | ORAL | Status: DC | PRN

## 2021-06-04 MED ORDER — ACETAMINOPHEN 500 MG PO TABS
1000.0000 mg | ORAL_TABLET | ORAL | Status: AC
  Administered 2021-06-04: 1000 mg via ORAL
  Filled 2021-06-04: qty 2

## 2021-06-04 MED ORDER — FENTANYL CITRATE PF 50 MCG/ML IJ SOSY: 25.0000 ug | PREFILLED_SYRINGE | INTRAMUSCULAR | Status: DC | PRN

## 2021-06-04 MED ORDER — POVIDONE-IODINE 10 % OINT PACKET
TOPICAL_OINTMENT | CUTANEOUS | Status: DC | PRN
  Administered 2021-06-04: 1 via TOPICAL

## 2021-06-04 MED ORDER — OXYCODONE HCL 5 MG/5ML PO SOLN: 5.0000 mg | Freq: Once | ORAL | Status: DC | PRN

## 2021-06-04 MED ORDER — SODIUM CHLORIDE (PF) 0.9 % IJ SOLN
INTRAMUSCULAR | Status: AC
  Filled 2021-06-04: qty 10

## 2021-06-04 MED ORDER — LIDOCAINE 2% (20 MG/ML) 5 ML SYRINGE
INTRAMUSCULAR | Status: DC | PRN
  Administered 2021-06-04: 60 mg via INTRAVENOUS

## 2021-06-04 MED ORDER — DEXAMETHASONE SODIUM PHOSPHATE 10 MG/ML IJ SOLN
INTRAMUSCULAR | Status: DC | PRN
  Administered 2021-06-04: 10 mg via INTRAVENOUS

## 2021-06-04 MED ORDER — CHLORHEXIDINE GLUCONATE 0.12 % MT SOLN
15.0000 mL | Freq: Once | OROMUCOSAL | Status: AC
  Administered 2021-06-04: 15 mL via OROMUCOSAL

## 2021-06-04 MED ORDER — ONDANSETRON HCL 4 MG/2ML IJ SOLN: 4.0000 mg | Freq: Once | INTRAMUSCULAR | Status: DC | PRN

## 2021-06-04 MED ORDER — BUPIVACAINE LIPOSOME 1.3 % IJ SUSP
INTRAMUSCULAR | Status: AC
  Filled 2021-06-04: qty 20

## 2021-06-04 MED ORDER — ONDANSETRON HCL 4 MG/2ML IJ SOLN
INTRAMUSCULAR | Status: DC | PRN
  Administered 2021-06-04: 4 mg via INTRAVENOUS

## 2021-06-04 MED ORDER — BUPIVACAINE LIPOSOME 1.3 % IJ SUSP: 20.0000 mL | Freq: Once | INTRAMUSCULAR | Status: DC

## 2021-06-04 MED ORDER — EPHEDRINE SULFATE-NACL 50-0.9 MG/10ML-% IV SOSY
PREFILLED_SYRINGE | INTRAVENOUS | Status: DC | PRN
  Administered 2021-06-04: 10 mg via INTRAVENOUS

## 2021-06-04 MED ORDER — ORAL CARE MOUTH RINSE: 15.0000 mL | Freq: Once | OROMUCOSAL | Status: AC

## 2021-06-04 MED ORDER — PROPOFOL 10 MG/ML IV BOLUS
INTRAVENOUS | Status: DC | PRN
Start: 2021-06-04 — End: 2021-06-04
  Administered 2021-06-04: 200 mg via INTRAVENOUS

## 2021-06-04 MED ORDER — MIDAZOLAM HCL 5 MG/5ML IJ SOLN
INTRAMUSCULAR | Status: DC | PRN
  Administered 2021-06-04: 2 mg via INTRAVENOUS

## 2021-06-04 MED ORDER — FENTANYL CITRATE (PF) 100 MCG/2ML IJ SOLN
INTRAMUSCULAR | Status: DC | PRN
Start: 2021-06-04 — End: 2021-06-04
  Administered 2021-06-04: 100 ug via INTRAVENOUS
  Administered 2021-06-04 (×2): 50 ug via INTRAVENOUS

## 2021-06-04 MED ORDER — BUPIVACAINE LIPOSOME 1.3 % IJ SUSP
INTRAMUSCULAR | Status: DC | PRN
  Administered 2021-06-04: 20 mL

## 2021-06-04 SURGICAL SUPPLY — 30 items
BAG COUNTER SPONGE SURGICOUNT (BAG) IMPLANT
BLADE HEX COATED 2.75 (ELECTRODE) ×2 IMPLANT
BLADE SURG 15 STRL LF DISP TIS (BLADE) ×1 IMPLANT
BLADE SURG 15 STRL SS (BLADE) ×1
COVER SURGICAL LIGHT HANDLE (MISCELLANEOUS) ×2 IMPLANT
DISSECTOR SURG LIGASURE 21 (MISCELLANEOUS) ×1 IMPLANT
DRSG PAD ABDOMINAL 8X10 ST (GAUZE/BANDAGES/DRESSINGS) ×1 IMPLANT
ELECT REM PT RETURN 15FT ADLT (MISCELLANEOUS) ×1 IMPLANT
GAUZE 4X4 16PLY ~~LOC~~+RFID DBL (SPONGE) ×2 IMPLANT
GAUZE SPONGE 4X4 12PLY STRL (GAUZE/BANDAGES/DRESSINGS) ×2 IMPLANT
GLOVE SURG LX 8.0 MICRO (GLOVE) ×1
GLOVE SURG LX STRL 8.0 MICRO (GLOVE) ×1 IMPLANT
GOWN SPEC L4 XLG W/TWL (GOWN DISPOSABLE) ×2 IMPLANT
GOWN STRL REUS W/ TWL XL LVL3 (GOWN DISPOSABLE) ×3 IMPLANT
GOWN STRL REUS W/TWL XL LVL3 (GOWN DISPOSABLE) ×3
KIT BASIN OR (CUSTOM PROCEDURE TRAY) ×2 IMPLANT
KIT TURNOVER KIT A (KITS) IMPLANT
NEEDLE HYPO 22GX1.5 SAFETY (NEEDLE) ×2 IMPLANT
PACK LITHOTOMY IV (CUSTOM PROCEDURE TRAY) ×2 IMPLANT
PANTS MESH DISP LRG (UNDERPADS AND DIAPERS) ×1 IMPLANT
PANTS MESH DISPOSABLE L (UNDERPADS AND DIAPERS) ×1
PENCIL SMOKE EVACUATOR (MISCELLANEOUS) IMPLANT
SHEARS HARMONIC 9CM CVD (BLADE) IMPLANT
SPIKE FLUID TRANSFER (MISCELLANEOUS) ×2 IMPLANT
SPONGE HEMORRHOID 8X3CM (HEMOSTASIS) IMPLANT
SURGILUBE 2OZ TUBE FLIPTOP (MISCELLANEOUS) ×2 IMPLANT
SUT CHROMIC 2 0 SH (SUTURE) IMPLANT
SUT CHROMIC 3 0 SH 27 (SUTURE) ×3 IMPLANT
SYR 20ML LL LF (SYRINGE) ×2 IMPLANT
UNDERPAD 30X36 HEAVY ABSORB (UNDERPADS AND DIAPERS) ×1 IMPLANT

## 2021-06-04 NOTE — Op Note (Signed)
Zhyon Modeste Troung  [birth year] ? ? ?06/04/2021 ? ? ? ?PCP:  Shelda Pal, DO ? ? ?Surgeon: Kaylyn Lim, MD, FACS ? ?Asst:  none ? ?Anes:  General LMA ? ?Preop Dx: Prolapsed hemorrhoids ?Postop Dx: same ? ?Procedure: Excision of three hemorrhoid columns-two were very large ?Location Surgery: [facility] ?Complications: None noted ? ?EBL:   20 cc ? ?Drains: none ? ?Description of Procedure: ? The patient was taken to OR 1 .  After anesthesia was administered and the patient was prepped  with betadine  and a timeout was performed.  Prolapsed hemorrhoids were very prominent.  The left side was worse and it was excised externally with a knife and then removed with the X sure ligasure device.  The skin was closed externally.  This was repeated on the right and then posteriorly.  Posteriorly it was closed transversely.  30 cc of dilute Exparel was injected as an anal block and betadine ointment was massaged into the anus.   ? ?The patient tolerated the procedure well and was taken to the PACU in stable condition.   ? ? ?Matt B. Hassell Done, MD, FACS ?[facility], [place] ?[phone]  ?

## 2021-06-04 NOTE — Anesthesia Postprocedure Evaluation (Signed)
Anesthesia Post Note ? ?Patient: Brad [name] ? ?Procedure(s) Performed: ANORECTAL EXAM UNDER ANESTHESIA (Rectum) ?HEMORRHOIDECTOMY (Rectum) ?LATERAL INTERNAL SPHINCTEROTOMY (Rectum) ? ?  ? ?Patient location during evaluation: PACU ?Anesthesia Type: General ?Level of consciousness: awake and alert ?Pain management: pain level controlled ?Vital Signs Assessment: post-procedure vital signs reviewed and stable ?Respiratory status: spontaneous breathing, nonlabored ventilation and respiratory function stable ?Cardiovascular status: stable and blood pressure returned to baseline ?Anesthetic complications: no ? ? ?No notable events documented. ? ?Last Vitals:  ?Vitals:  ? 06/04/21 1530 06/04/21 1545  ?BP: 130/76 113/77  ?Pulse: 82 (!) 58  ?Resp: 16 12  ?Temp:  36.7 ?C  ?SpO2: 96% 96%  ?  ?Last Pain:  ?Vitals:  ? 06/04/21 1545  ?TempSrc:   ?PainSc: 0-No pain  ? ? ?  ?  ?  ?  ?  ?  ? ?Audry Pili ? ? ? ? ?

## 2021-06-04 NOTE — Interval H&P Note (Signed)
History and Physical Interval Note: ? ?06/04/2021 ?1:52 PM ? ?Brad [name]  has presented today for surgery, with the diagnosis of PAINFUL, BLOODY BOWEL MOVEMENTS WITH HEMORRHOIDS.  The various methods of treatment have been discussed with the patient and family. After consideration of risks, benefits and other options for treatment, the patient has consented to  Procedure(s): ?ANORECTAL EXAM UNDER ANESTHESIA (N/A) ?HEMORRHOIDECTOMY (N/A) ?LATERAL INTERNAL SPHINCTEROTOMY (N/A) as a surgical intervention.  The patient's history has been reviewed, patient examined, no change in status, stable for surgery.  I have reviewed the patient's chart and labs.  Questions were answered to the patient's satisfaction.   ? ? ?[name] ? ? ?

## 2021-06-04 NOTE — Transfer of Care (Signed)
Immediate Anesthesia Transfer of Care Note ? ?Patient: Brad [name] ? ?Procedure(s) Performed: ANORECTAL EXAM UNDER ANESTHESIA ?HEMORRHOIDECTOMY (Rectum) ?LATERAL INTERNAL SPHINCTEROTOMY ? ?Patient Location: PACU ? ?Anesthesia Type:General ? ?Level of Consciousness: awake, alert  and oriented ? ?Airway & Oxygen Therapy: Patient Spontanous Breathing and Patient connected to face mask oxygen ? ?Post-op Assessment: Report given to RN and Post -op Vital signs reviewed and stable ? ?Post vital signs: Reviewed and stable ? ?Last Vitals:  ?Vitals Value Taken Time  ?BP 142/95 06/04/21 1517  ?Temp    ?Pulse 72 06/04/21 1518  ?Resp 12 06/04/21 1518  ?SpO2 100 % 06/04/21 1518  ?Vitals shown include unvalidated device data. ? ?Last Pain:  ?Vitals:  ? 06/04/21 1234  ?TempSrc:   ?PainSc: 7   ?   ? ?Patients Stated Pain Goal: 3 (06/04/21 1234) ? ?Complications: No notable events documented. ?

## 2021-06-04 NOTE — Anesthesia Procedure Notes (Signed)
Procedure Name: LMA Insertion ?Date/Time: 06/04/2021 2:13 PM ?Performed by: Kizzie Fantasia, CRNA ?Pre-anesthesia Checklist: Patient identified, Emergency Drugs available, Suction available, Timeout performed and Patient being monitored ?Patient Re-evaluated:Patient Re-evaluated prior to induction ?Oxygen Delivery Method: Circle system utilized ?Preoxygenation: Pre-oxygenation with 100% oxygen ?Induction Type: IV induction ?Ventilation: Mask ventilation without difficulty ?LMA: LMA inserted ?LMA Size: 4.0 ?Number of attempts: 1 ?Airway Equipment and Method: Stylet ?Placement Confirmation: positive ETCO2 and breath sounds checked- equal and bilateral ?Tube secured with: Tape ?Dental Injury: Teeth and Oropharynx as per pre-operative assessment  ? ? ? ? ?

## 2021-06-04 NOTE — Anesthesia Preprocedure Evaluation (Addendum)
Anesthesia Evaluation  ?Patient identified by MRN, date of birth, ID band ?Patient awake ? ? ? ?Reviewed: ?Allergy & Precautions, NPO status , Patient's Chart, lab work & pertinent test results ? ?History of Anesthesia Complications ?Negative for: history of anesthetic complications ? ?Airway ?Mallampati: I ? ?TM Distance: >3 FB ?Neck ROM: Full ? ? ? Dental ? ?(+) Dental Advisory Given, Chipped,  ?  ?Pulmonary ?Current Smoker and Patient abstained from smoking.,  ?  ?Pulmonary exam normal ? ? ? ? ? ? ? Cardiovascular ?negative cardio ROS ?Normal cardiovascular exam ? ? ?  ?Neuro/Psych ? Headaches, PSYCHIATRIC DISORDERS Depression   ? GI/Hepatic ?negative GI ROS, (+)  ?  ? substance abuse ? marijuana use,   ?Endo/Other  ? ?Obesity ? ? Renal/GU ?negative Renal ROS  ? ?  ?Musculoskeletal ?negative musculoskeletal ROS ?(+)  ? Abdominal ?  ?Peds ? Hematology ?negative hematology ROS ?(+)   ?Anesthesia Other Findings ? ? Reproductive/Obstetrics ? ?  ? ? ? ? ? ? ? ? ? ? ? ? ? ?  ?  ? ? ? ? ? ? ? ?Anesthesia Physical ?Anesthesia Plan ? ?ASA: 2 ? ?Anesthesia Plan: General  ? ?Post-op Pain Management: Tylenol PO (pre-op)*  ? ?Induction: Intravenous ? ?PONV Risk Score and Plan: 2 and Treatment may vary due to age or medical condition, Ondansetron, Dexamethasone and Midazolam ? ?Airway Management Planned: LMA ? ?Additional Equipment: None ? ?Intra-op Plan:  ? ?Post-operative Plan: Extubation in OR ? ?Informed Consent: I have reviewed the patients History and Physical, chart, labs and discussed the procedure including the risks, benefits and alternatives for the proposed anesthesia with the patient or authorized representative who has indicated his/her understanding and acceptance.  ? ? ? ?Dental advisory given and Interpreter used for interveiw ? ?Plan Discussed with: CRNA and Anesthesiologist ? ?Anesthesia Plan Comments:   ? ? ? ? ?Anesthesia Quick Evaluation ? ?

## 2021-06-04 NOTE — Progress Notes (Signed)
Wife, [place], at bedside to provide Spanish interpreting. Waiver signed and placed in chart.  ?

## 2021-06-05 ENCOUNTER — Encounter (HOSPITAL_COMMUNITY): Payer: Self-pay | Admitting: Surgery

## 2021-06-07 LAB — SURGICAL PATHOLOGY

## 2021-06-16 ENCOUNTER — Ambulatory Visit (INDEPENDENT_AMBULATORY_CARE_PROVIDER_SITE_OTHER): Payer: Self-pay | Admitting: Family Medicine

## 2021-06-16 ENCOUNTER — Encounter: Payer: Self-pay | Admitting: Family Medicine

## 2021-06-16 VITALS — BP 108/72 | HR 64 | Temp 98.5°F | Ht 65.0 in | Wt 183.4 lb

## 2021-06-16 DIAGNOSIS — B029 Zoster without complications: Secondary | ICD-10-CM

## 2021-06-16 MED ORDER — VALACYCLOVIR HCL 1 G PO TABS: 1000.0000 mg | ORAL_TABLET | Freq: Three times a day (TID) | ORAL | 0 refills | Status: AC

## 2021-06-16 MED ORDER — PREDNISONE 20 MG PO TABS: 40.0000 mg | ORAL_TABLET | Freq: Every day | ORAL | 0 refills | Status: AC

## 2021-06-16 NOTE — Progress Notes (Signed)
Chief Complaint  ?Patient presents with  ? Rash  ?  On back  ? ? ?Brad [name] is a 26 y.o. male here for a skin complaint. Here w GF who helps interpret (Spanish) ? ?Duration: 3 days ?Location: L lower portion of back ?Pruritic? Yes ?Painful? No ?Drainage? No ?New soaps/lotions/topicals/detergents? No ?Sick contacts? No ?Other associated symptoms: getting bigger ?Therapies tried thus far: None ? ?Past Medical History:  ?Diagnosis Date  ? Headache   ? migraines  ? Hemorrhoids   ? No known health problems   ? ? ?BP 108/72   Pulse 64   Temp 98.5 ?F (36.9 ?C) (Oral)   Ht 5\' 5"  (1.651 m)   Wt 183 lb 6 oz (83.2 kg)   SpO2 99%   BMI 30.52 kg/m?  ?Gen: awake, alert, appearing stated age ?Lungs: No accessory muscle use ?Skin: see below.  ?Psych: Age appropriate judgment and insight ? ? ?L lower back ? ?Herpes zoster without complication - Plan: valACYclovir (VALTREX) 1000 MG tablet, predniSONE (DELTASONE) 20 MG tablet ? ?Orders as above, 7 d. Bad luck given his age.   ?F/u prn. ?The patient and his GF voiced understanding and agreement to the plan. ? ? , DO ?06/16/21 ?11:54 AM ? ?

## 2021-06-16 NOTE — Patient Instructions (Signed)
Finish the medications please.  ? ?Let us know if you need anything. ?

## 2021-06-29 ENCOUNTER — Ambulatory Visit: Payer: Self-pay | Admitting: Family Medicine

## 2021-09-28 ENCOUNTER — Ambulatory Visit: Payer: Self-pay

## 2021-09-28 ENCOUNTER — Ambulatory Visit (INDEPENDENT_AMBULATORY_CARE_PROVIDER_SITE_OTHER): Payer: Self-pay | Admitting: Family Medicine

## 2021-09-28 ENCOUNTER — Ambulatory Visit (INDEPENDENT_AMBULATORY_CARE_PROVIDER_SITE_OTHER): Payer: Self-pay

## 2021-09-28 VITALS — BP 130/78 | HR 68 | Ht 65.0 in | Wt 185.6 lb

## 2021-09-28 DIAGNOSIS — M25562 Pain in left knee: Secondary | ICD-10-CM

## 2021-09-28 DIAGNOSIS — G8929 Other chronic pain: Secondary | ICD-10-CM

## 2021-09-28 NOTE — Patient Instructions (Addendum)
Thank you for coming in today.   Please use Voltaren gel (Generic Diclofenac Gel) up to 4x daily for pain as needed.  This is available over-the-counter as both the name brand Voltaren gel and the generic diclofenac gel.   You received an injection today. Seek immediate medical attention if the joint becomes red, extremely painful, or is oozing fluid.   Wear kneeling knee pads when at work.   Check back before Oct 6th
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
## 2021-09-29 ENCOUNTER — Encounter: Payer: Self-pay | Admitting: Family Medicine

## 2021-09-29 NOTE — Progress Notes (Signed)
Left knee x-ray shows no arthritis

## 2021-10-01 ENCOUNTER — Ambulatory Visit (INDEPENDENT_AMBULATORY_CARE_PROVIDER_SITE_OTHER): Payer: Self-pay | Admitting: Family Medicine

## 2021-10-01 ENCOUNTER — Encounter: Payer: Self-pay | Admitting: Sports Medicine

## 2021-10-01 VITALS — BP 128/80 | HR 67 | Ht 65.0 in | Wt 183.0 lb

## 2021-10-01 DIAGNOSIS — M25562 Pain in left knee: Secondary | ICD-10-CM

## 2021-10-01 DIAGNOSIS — G8929 Other chronic pain: Secondary | ICD-10-CM

## 2021-10-01 MED ORDER — MELOXICAM 15 MG PO TABS: 15.0000 mg | ORAL_TABLET | Freq: Every day | ORAL | 0 refills | Status: DC

## 2021-10-01 NOTE — Patient Instructions (Addendum)
Thank you for coming in today.   You should hear from MRI scheduling within 1 week. If you do not hear please let me know.    Check back after MRI.   Phone number [phone]
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
## 2021-10-02 ENCOUNTER — Ambulatory Visit (INDEPENDENT_AMBULATORY_CARE_PROVIDER_SITE_OTHER): Payer: Self-pay

## 2021-10-02 DIAGNOSIS — M25562 Pain in left knee: Secondary | ICD-10-CM

## 2021-10-02 DIAGNOSIS — G8929 Other chronic pain: Secondary | ICD-10-CM

## 2021-10-04 ENCOUNTER — Encounter (HOSPITAL_BASED_OUTPATIENT_CLINIC_OR_DEPARTMENT_OTHER): Payer: Self-pay | Admitting: Emergency Medicine

## 2021-10-04 ENCOUNTER — Emergency Department (HOSPITAL_BASED_OUTPATIENT_CLINIC_OR_DEPARTMENT_OTHER): Payer: Self-pay

## 2021-10-04 ENCOUNTER — Other Ambulatory Visit: Payer: Self-pay

## 2021-10-04 ENCOUNTER — Emergency Department (HOSPITAL_BASED_OUTPATIENT_CLINIC_OR_DEPARTMENT_OTHER)
Admission: EM | Admit: 2021-10-04 | Discharge: 2021-10-04 | Disposition: A | Discharge status: Home / Self Care | Payer: Self-pay | Attending: Emergency Medicine | Admitting: Emergency Medicine

## 2021-10-04 DIAGNOSIS — F17210 Nicotine dependence, cigarettes, uncomplicated: Secondary | ICD-10-CM | POA: Insufficient documentation

## 2021-10-04 DIAGNOSIS — G43809 Other migraine, not intractable, without status migrainosus: Secondary | ICD-10-CM | POA: Insufficient documentation

## 2021-10-04 DIAGNOSIS — M7918 Myalgia, other site: Secondary | ICD-10-CM | POA: Insufficient documentation

## 2021-10-04 HISTORY — DX: Depression, unspecified: F32.A

## 2021-10-04 LAB — CBC WITH DIFFERENTIAL/PLATELET
Abs Immature Granulocytes: 0.04 10*3/uL (ref 0.00–0.07)
Basophils Absolute: 0.1 10*3/uL (ref 0.0–0.1)
Basophils Relative: 1 %
Eosinophils Absolute: 0.1 10*3/uL (ref 0.0–0.5)
Eosinophils Relative: 1 %
HCT: 41.6 % (ref 39.0–52.0)
Hemoglobin: 14.4 g/dL (ref 13.0–17.0)
Immature Granulocytes: 0 %
Lymphocytes Relative: 21 %
Lymphs Abs: 2.6 10*3/uL (ref 0.7–4.0)
MCH: 31.6 pg (ref 26.0–34.0)
MCHC: 34.6 g/dL (ref 30.0–36.0)
MCV: 91.4 fL (ref 80.0–100.0)
Monocytes Absolute: 0.7 10*3/uL (ref 0.1–1.0)
Monocytes Relative: 6 %
Neutro Abs: 8.7 10*3/uL — ABNORMAL HIGH (ref 1.7–7.7)
Neutrophils Relative %: 71 %
Platelets: 272 10*3/uL (ref 150–400)
RBC: 4.55 MIL/uL (ref 4.22–5.81)
RDW: 12.3 % (ref 11.5–15.5)
WBC: 12.2 10*3/uL — ABNORMAL HIGH (ref 4.0–10.5)
nRBC: 0 % (ref 0.0–0.2)

## 2021-10-04 LAB — BASIC METABOLIC PANEL
Anion gap: 10 (ref 5–15)
BUN: 7 mg/dL (ref 6–20)
CO2: 27 mmol/L (ref 22–32)
Calcium: 9.3 mg/dL (ref 8.9–10.3)
Chloride: 103 mmol/L (ref 98–111)
Creatinine, Ser: 0.83 mg/dL (ref 0.61–1.24)
GFR, Estimated: >60 mL/min (ref >60)
Glucose, Bld: 100 mg/dL — ABNORMAL HIGH (ref 70–99)
Potassium: 3.5 mmol/L (ref 3.5–5.1)
Sodium: 140 mmol/L (ref 135–145)

## 2021-10-04 MED ORDER — KETOROLAC TROMETHAMINE 15 MG/ML IJ SOLN
15.0000 mg | Freq: Once | INTRAMUSCULAR | Status: AC
  Administered 2021-10-04: 15 mg via INTRAVENOUS
  Filled 2021-10-04: qty 1

## 2021-10-04 MED ORDER — PROCHLORPERAZINE EDISYLATE 10 MG/2ML IJ SOLN
10.0000 mg | Freq: Once | INTRAMUSCULAR | Status: AC
  Administered 2021-10-04: 10 mg via INTRAVENOUS
  Filled 2021-10-04: qty 2

## 2021-10-04 MED ORDER — SODIUM CHLORIDE 0.9 % IV BOLUS
1000.0000 mL | Freq: Once | INTRAVENOUS | Status: AC
  Administered 2021-10-04: 1000 mL via INTRAVENOUS

## 2021-10-04 MED ORDER — DIPHENHYDRAMINE HCL 50 MG/ML IJ SOLN
12.5000 mg | Freq: Once | INTRAMUSCULAR | Status: AC
  Administered 2021-10-04: 12.5 mg via INTRAVENOUS
  Filled 2021-10-04: qty 1

## 2021-10-04 NOTE — Discharge Instructions (Addendum)
Lo evaluamos por su sntoma de hormigueo en la mitad de su cuerpo y Engineer, mining en la mitad de su cuerpo. Sus pruebas de laboratorio fueron tranquilizadoras y su tomografa computarizada de cabeza fue negativa. A veces, puedes tener los sntomas debido a una migraa [place], que es un tipo de migraa que causa sntomas distintos a los dolores de [place]. Esta puede ser la causa de sus sntomas.  Tome Tylenol y Motrin para sus sntomas en casa. Puede tomar 650 mg de Tylenol cada 6 horas y 600 mg de ibuprofeno cada 6 horas segn sea necesario para sus sntomas. Puede tomar estos medicamentos juntos segn sea necesario, al mismo tiempo o alternndolos cada 3 horas.  We evaluated you for your symptom of tingling on half of your body and pain on half of your body.  Your lab tests were reassuring and your head CT was negative.  Sometimes, you can get the symptoms due to a complex migraine, which is a type of migraine that causes symptoms other than headaches.  This may be the cause of your symptoms.  Please take Tylenol and Motrin for your symptoms at home.  You can take 650 mg of Tylenol every 6 hours and 600 mg of ibuprofen every 6 hours as needed for your symptoms.  You can take these medicines together as needed, either at the same time, or alternating every 3 hours.

## 2021-10-04 NOTE — ED Triage Notes (Signed)
Vomiting since last weekend. Visitor states pt had a steroid injection in L knee on Tuesday and the next day he c/o L sided numbness. Reports body aches L>R from shoulder to toes that started 9/2. Pt speaks spanish but does seem to have difficulty articulating his sx even to his spanish speaking visitor.

## 2021-10-04 NOTE — ED Provider Notes (Addendum)
[facility] EMERGENCY DEPARTMENT Provider Note  CSN: [id number] Arrival date & time: 10/04/21 3235  Chief Complaint(s) Generalized Body Aches  HPI Brad [name] is a 26 y.o. male without significant past medical history presenting to the emergency department with body aches.  Patient reports a few days of left-sided body pain, numbness, nausea, vomiting, headache.  He reports he recently got a steroid injection in his left knee.  No weakness.  No facial droop.  No vision changes, neck stiffness, fevers.  No cough, sore throat, runny nose.  Symptoms are mild.  No recent travel or surgeries.  No personal or family history of blood clot.  Qualified healthcare interpreter used   Past Medical History Past Medical History:  Diagnosis Date   Depression    Headache    migraines   Hemorrhoids    No known health problems    Patient Active Problem List   Diagnosis Date Noted   Generalized abdominal pain 12/30/2020   Dysthymia 09/30/2020   Strain of left trapezius muscle 09/30/2020   Chronic constipation    Home Medication(s) Prior to Admission medications   Medication Sig Start Date End Date Taking? Authorizing Provider  meloxicam (MOBIC) 15 MG tablet Take 1 tablet (15 mg total) by mouth daily. 10/01/21   Rodolph Bong, MD                                                                                                                                    Past Surgical History Past Surgical History:  Procedure Laterality Date   ANAL RECTAL MANOMETRY N/A 07/24/2020   Procedure: ANO RECTAL MANOMETRY;  Surgeon: Shellia Cleverly, DO;  Location: [facility];  Service: Gastroenterology;  Laterality: N/A;   HEMORRHOID SURGERY N/A 06/04/2021   Procedure: HEMORRHOIDECTOMY;  Surgeon: Luretha Murphy, MD;  Location: [facility];  Service: General;  Laterality: N/A;   NO PAST SURGERIES     OTHER SURGICAL HISTORY     On Penis   RECTAL EXAM UNDER ANESTHESIA N/A 06/04/2021   Procedure:  ANORECTAL EXAM UNDER ANESTHESIA;  Surgeon: Luretha Murphy, MD;  Location: [facility];  Service: General;  Laterality: N/A;   SPHINCTEROTOMY N/A 06/04/2021   Procedure: LATERAL INTERNAL SPHINCTEROTOMY;  Surgeon: Luretha Murphy, MD;  Location: [facility];  Service: General;  Laterality: N/A;   Family History Family History  Problem Relation Age of Onset   Diabetes Mother    Diabetes Maternal Grandmother    Diabetes Maternal Grandfather    Cancer Maternal Grandfather    Prostate cancer Maternal Grandfather    Colon cancer Neg Hx    Esophageal cancer Neg Hx    Rectal cancer Neg Hx    Stomach cancer Neg Hx     Social History Social History   Tobacco Use   Smoking status: Every Day    Packs/day: 1.00    Types: Cigarettes   Smokeless tobacco: Never   Tobacco  comments:    10-15 cigarettes a day   Vaping Use   Vaping Use: Some days   Devices: THC  Substance Use Topics   Alcohol use: Not Currently   Drug use: Yes    Types: Marijuana    Comment: daily   Allergies Patient has no known allergies.  Review of Systems Review of Systems  Physical Exam Vital Signs  I have reviewed the triage vital signs BP 101/68   Pulse (!) 55   Temp 99 F (37.2 C) (Oral)   Resp 16   Ht 5\' 6"  (1.676 m)   Wt 83.6 kg   SpO2 100%   BMI 29.76 kg/m  Physical Exam Vitals and nursing note reviewed.  Constitutional:      General: He is not in acute distress.    Appearance: Normal appearance.  HENT:     Mouth/Throat:     Mouth: Mucous membranes are moist.  Eyes:     Conjunctiva/sclera: Conjunctivae normal.  Cardiovascular:     Rate and Rhythm: Normal rate and regular rhythm.  Pulmonary:     Effort: Pulmonary effort is normal. No respiratory distress.     Breath sounds: Normal breath sounds.  Abdominal:     General: Abdomen is flat.     Palpations: Abdomen is soft.     Tenderness: There is no abdominal tenderness.  Musculoskeletal:        General: No swelling or tenderness.     Right lower  leg: No edema.     Left lower leg: No edema.  Skin:    General: Skin is warm and dry.     Capillary Refill: Capillary refill takes less than 2 seconds.  Neurological:     Mental Status: He is alert and oriented to person, place, and time. Mental status is at baseline.     Comments: Cranial nerves II through XII intact, strength 5 out of 5 in the bilateral upper and lower extremities, no sensory deficit to light touch, no dysmetria on finger-nose-finger testing, ambulatory with steady gait.   Psychiatric:        Mood and Affect: Mood normal.        Behavior: Behavior normal.     ED Results and Treatments Labs (all labs ordered are listed, but only abnormal results are displayed) Labs Reviewed  BASIC METABOLIC PANEL - Abnormal; Notable for the following components:      Result Value   Glucose, Bld 100 (*)    All other components within normal limits  CBC WITH DIFFERENTIAL/PLATELET - Abnormal; Notable for the following components:   WBC 12.2 (*)    Neutro Abs 8.7 (*)    All other components within normal limits                                                                                                                          Radiology CT Head Wo Contrast  Result Date: 10/04/2021 CLINICAL DATA:  Sudden headache.  Vomiting.  Left-sided numbness. EXAM: CT HEAD WITHOUT CONTRAST TECHNIQUE: Contiguous axial images were obtained from the base of the skull through the vertex without intravenous contrast. RADIATION DOSE REDUCTION: This exam was performed according to the departmental dose-optimization program which includes automated exposure control, adjustment of the mA and/or kV according to patient size and/or use of iterative reconstruction technique. COMPARISON:  02/25/2017. FINDINGS: Brain: No evidence of acute infarction, hemorrhage, hydrocephalus, extra-axial collection or mass lesion/mass effect. Vascular: No hyperdense vessel or unexpected calcification. Skull: Normal. Negative for  fracture or focal lesion. Sinuses/Orbits: Visualized globes and orbits are unremarkable. The visualized sinuses are clear. Other: None. IMPRESSION: Normal unenhanced CT scan of the brain. Electronically Signed   By: Amie Portland M.D.   On: 10/04/2021 08:46   DG Chest 2 View  Result Date: 10/04/2021 CLINICAL DATA:  Body aches. EXAM: CHEST - 2 VIEW COMPARISON:  05/16/2016. FINDINGS: The lungs are clear without focal pneumonia, edema, pneumothorax or pleural effusion. The cardiopericardial silhouette is within normal limits for size. The visualized bony structures of the thorax are unremarkable. IMPRESSION: Stable.  No acute findings. Electronically Signed   By: Kennith Center M.D.   On: 10/04/2021 08:43    Pertinent labs & imaging results that were available during my care of the patient were reviewed by me and considered in my medical decision making (see MDM for details).  Medications Ordered in ED Medications  sodium chloride 0.9 % bolus 1,000 mL (0 mLs Intravenous Stopped 10/04/21 0933)  prochlorperazine (COMPAZINE) injection 10 mg (10 mg Intravenous Given 10/04/21 0827)  ketorolac (TORADOL) 15 MG/ML injection 15 mg (15 mg Intravenous Given 10/04/21 0830)  diphenhydrAMINE (BENADRYL) injection 12.5 mg (12.5 mg Intravenous Given 10/04/21 0828)                                                                                                                                     Procedures .1-3 Lead EKG Interpretation  Performed by: Lonell Grandchild, MD Authorized by: Lonell Grandchild, MD     Interpretation: abnormal     ECG rate:  54   ECG rate assessment: bradycardic     Rhythm: sinus bradycardia     Ectopy: none     Conduction: normal     (including critical care time)  Medical Decision Making / ED Course   MDM:  26 year old male presenting to the emergency department with left-sided body pain..  Overall well-appearing, neurologic exam reassuring.  No focal left-sided tenderness and  no left-sided sensory deficits.  Unclear cause of symptoms could be related to possible complex migraine although patient has no previous history.  Will treat with migraine cocktail.  Patient also reports left-sided chest pains will obtain EKG and chest x-ray but low concern for ACS given age and extremely atypical symptoms, no shortness of breath, diaphoresis.  Doubt medication reaction to steroids.  Knee where steroids were injected without sign of effusion, erythema, tenderness,  painful range of motion, no sign of septic joint.  Will reassess.  Clinical Course as of 10/04/21 1125  Mon Oct 04, 2021  1122 Patient's symptoms resolved after migraine cocktail.  Labs reassuring including normal electrolytes.  Chest x-ray clear.  CT head without acute intracranial process. Will discharge patient to home. All questions answered. Patient comfortable with plan of discharge. Return precautions discussed with patient and specified on the after visit summary.  [WS]    Clinical Course User Index [WS] Suezanne Jacquet, Jerilee Field, MD     Additional history obtained: -Additional history obtained from family -External records from outside source obtained and reviewed including: Chart review including previous notes, labs, imaging, consultation notes   Lab Tests: -I ordered, reviewed, and interpreted labs.   The pertinent results include:   Labs Reviewed  BASIC METABOLIC PANEL - Abnormal; Notable for the following components:      Result Value   Glucose, Bld 100 (*)    All other components within normal limits  CBC WITH DIFFERENTIAL/PLATELET - Abnormal; Notable for the following components:   WBC 12.2 (*)    Neutro Abs 8.7 (*)    All other components within normal limits      EKG   EKG Interpretation  Date/Time:  Monday October 04 2021 08:24:13 EDT Ventricular Rate:  61 PR Interval:  141 QRS Duration: 90 QT Interval:  402 QTC Calculation: 405 R Axis:   76 Text Interpretation: Sinus rhythm  Confirmed by Alvino Blood ([id number]) on 10/04/2021 11:23:26 AM         Imaging Studies ordered: I ordered imaging studies including CT head, CXR On my interpretation imaging demonstrates no acute finding I independently visualized and interpreted imaging. I agree with the radiologist interpretation   Medicines ordered and prescription drug management: Meds ordered this encounter  Medications   sodium chloride 0.9 % bolus 1,000 mL   prochlorperazine (COMPAZINE) injection 10 mg   ketorolac (TORADOL) 15 MG/ML injection 15 mg   diphenhydrAMINE (BENADRYL) injection 12.5 mg    -I have reviewed the patients home medicines and have made adjustments as needed      Cardiac Monitoring: The patient was maintained on a cardiac monitor.  I personally viewed and interpreted the cardiac monitored which showed an underlying rhythm of: NSR  Social Determinants of Health:  Factors impacting patients care include: current smoker   Reevaluation: After the interventions noted above, I reevaluated the patient and found that they have improved  Co morbidities that complicate the patient evaluation  Past Medical History:  Diagnosis Date   Depression    Headache    migraines   Hemorrhoids    No known health problems       Dispostion: Discharge    Final Clinical Impression(s) / ED Diagnoses Final diagnoses:  Other migraine without status migrainosus, not intractable     This chart was dictated using voice recognition software.  Despite best efforts to proofread,  errors can occur which can change the documentation meaning.    Lonell Grandchild, MD 10/04/21 1124    Lonell Grandchild, MD 10/04/21 1125    Lonell Grandchild, MD 10/04/21 1125

## 2021-10-06 ENCOUNTER — Telehealth: Payer: Self-pay | Admitting: Family Medicine

## 2021-10-06 NOTE — Telephone Encounter (Signed)
Called left message to call back 

## 2021-10-06 NOTE — Telephone Encounter (Signed)
Pt's wife called and stated he has been having body chills and he is not sure why. He had a knee injection a couple days ago and he has been having chills ever since, but is not sure if this is related. He was evaluated at the ED but they did not find anything, and he has not been tested for any virus. He did not want to see another provider so scheduled him on Monday, but if anything is open sooner he would like a call. Please advise.

## 2021-10-06 NOTE — Progress Notes (Signed)
Knee MRI is normal.

## 2021-10-08 NOTE — Telephone Encounter (Signed)
Called left message unable to get on the phone.

## 2021-10-11 ENCOUNTER — Ambulatory Visit: Payer: Self-pay | Admitting: Family Medicine
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
## 2021-11-01 ENCOUNTER — Ambulatory Visit (INDEPENDENT_AMBULATORY_CARE_PROVIDER_SITE_OTHER): Payer: Self-pay | Admitting: Family Medicine

## 2021-11-01 VITALS — BP 112/74 | HR 71 | Ht 66.0 in | Wt 174.0 lb

## 2021-11-01 DIAGNOSIS — G8929 Other chronic pain: Secondary | ICD-10-CM

## 2021-11-01 DIAGNOSIS — M25562 Pain in left knee: Secondary | ICD-10-CM

## 2021-11-01 NOTE — Patient Instructions (Signed)
Thank you for coming in today.   Please use Voltaren gel (Generic Diclofenac Gel) up to 4x daily for pain as needed.  This is available over-the-counter as both the name brand Voltaren gel and the generic diclofenac gel.   I recommend you obtained a compression sleeve to help with your joint problems. There are many options on the market however I recommend obtaining a full knee Body Helix compression sleeve.  You can find information (including how to appropriate measure yourself for sizing) can be found at www.Body http://www.lambert.com/.  Many of these products are health savings account (HSA) eligible.   You can use the compression sleeve at any time throughout the day but is most important to use while being active as well as for 2 hours post-activity.   It is appropriate to ice following activity with the compression sleeve in place.   Recheck with me as needed.

## 2021-11-17 ENCOUNTER — Telehealth: Payer: Self-pay | Admitting: Family Medicine

## 2021-11-17 NOTE — Telephone Encounter (Signed)
Called left detailed message of to schedule per PCP at 4  today.

## 2021-11-17 NOTE — Telephone Encounter (Signed)
Pt's wife called to see If pcp could see him today. She stated it feels like maybe an anxiety attack as he is having SOB, chest tightness and a racing heart. Transferred to triage for nurse eval to see what they recommend.

## 2021-11-17 NOTE — Telephone Encounter (Signed)
Pt called and lvm to return call , pcp ok'd for him to be seen today at 4pm

## 2021-11-17 NOTE — Telephone Encounter (Signed)
Nurse Assessment Nurse: Rolin Barry, RN, Levada Dy Date/Time Brad [name] Time): 11/17/2021 1:03:47 PM Confirm and document reason for call. If symptomatic, describe symptoms. ---Wife says her husband feels like he is having anxiety attack along with high HR, chest pain and SOB. Sx started 4 days ago. Advised that chest started hurting on Monday. Does the patient have any new or worsening symptoms? ---Yes Will a triage be completed? ---Yes Related visit to physician within the last 2 weeks? ---No Does the PT have any chronic conditions? (i.e. diabetes, asthma, this includes High risk factors for pregnancy, etc.) ---Yes List chronic conditions. ---depression Is this a behavioral health or substance abuse call? ---No Guidelines Guideline Title Affirmed Question Affirmed Notes Nurse Date/Time (Eastern Time) Chest Pain [1] Chest pain lasts > 5 minutes AND [2] described as crushing, pressure-like, or heavy Deaton, RN, Levada Dy 11/17/2021 1:05:19 PM PLEASE NOTE: All timestamps contained within this report are represented as [place] Standard Time. CONFIDENTIALTY NOTICE: This fax transmission is intended only for the addressee. It contains information that is legally privileged, confidential or otherwise protected from use or disclosure. If you are not the intended recipient, you are strictly prohibited from reviewing, disclosing, copying using or disseminating any of this information or taking any action in reliance on or regarding this information. If you have received this fax in error, please notify us immediately by telephone so that we can arrange for its return to [place]. Phone: [phone], Toll-Free: [phone], Fax: [phone] Page: 2 of 2 Call Id: [id number] [place]. Time Brad [name] Time) Disposition Final User 11/17/2021 1:01:35 PM Send to Urgent Corliss Skains 11/17/2021 1:08:10 PM Call EMS 911 Now Yes Deaton, RN, Levada Dy 11/17/2021 1:09:52 PM 911 Outcome Documentation Deaton, RN,  Levada Dy Reason: Patient refused, advised that wife will drive him to the ED NOW by POV. Final Disposition 11/17/2021 1:08:10 PM Call EMS 911 Now Yes Deaton, RN, Cindee Lame Disagree/Comply Disagree Caller Understands Yes PreDisposition Did not know what to do Care Advice Given Per Guideline CALL EMS 911 NOW: * Immediate medical attention is needed. You need to hang up and call 911 (or an ambulance). * Triager Discretion: I'll call you back in a few minutes to be sure you were able to reach them. CARE ADVICE given per Chest Pain (Adult) guideline. Referrals GO TO FACILITY OTHER - SPECIFY

## 2022-03-14 ENCOUNTER — Encounter: Payer: Self-pay | Admitting: Family Medicine

## 2022-03-14 ENCOUNTER — Ambulatory Visit (INDEPENDENT_AMBULATORY_CARE_PROVIDER_SITE_OTHER): Payer: Commercial Managed Care - HMO

## 2022-03-14 ENCOUNTER — Ambulatory Visit: Payer: Commercial Managed Care - HMO | Admitting: Family Medicine

## 2022-03-14 VITALS — BP 138/80 | HR 83 | Ht 65.0 in | Wt 188.8 lb

## 2022-03-14 DIAGNOSIS — M5416 Radiculopathy, lumbar region: Secondary | ICD-10-CM

## 2022-03-14 MED ORDER — GABAPENTIN 300 MG PO CAPS: 300.0000 mg | ORAL_CAPSULE | Freq: Every evening | ORAL | 3 refills | Status: DC | PRN

## 2022-03-14 MED ORDER — PREDNISONE 50 MG PO TABS: 50.0000 mg | ORAL_TABLET | Freq: Every day | ORAL | 0 refills | Status: DC

## 2022-03-14 NOTE — Patient Instructions (Addendum)
Thank you for coming in today.   Please get an Xray today before you leave   You should hear from MRI scheduling within 1 week. If you do not hear please let me know.    Check back after MRI

## 2022-03-14 NOTE — Progress Notes (Unsigned)
I, Brad [name], CMA acting as a scribe for [name], MD.  Brad [name] is a 27 y.o. male who presents to [place] at [facility] today for con'td L knee pain. Pt was last seen by Dr. Georgina Snell on 11/01/21 and was advised to cont knee compression sleeve, Voltaren gel, and the importance of good knee padding when kneeling was stressed. Pt's last R knee steroid injection was on 09/28/21.   Today, pt reports continued LEFT knee pain, minimal change since last visit.. No longer using Voltaren Gel but has been wearing compression. Notes intermittent swelling. Denies mechanical sx. C/O n/t around that knee that expands into the leg. Pain primarily at lateral aspect of the knee. Taking OTC med from [place].  Pain extends from the lateral knee to the lateral calf.  Pain is worse with activity and at bedtime.  Dx imaging: 10/02/21 L knee MRI             09/28/21 L knee XR 07/12/18 R knee XR  Pertinent review of systems: No fevers or chills  Relevant historical information: Otherwise healthy   Exam:  BP 138/80   Pulse 83   Ht 5' 5"$  (1.651 m)   Wt 188 lb 12.8 oz (85.6 kg)   SpO2 96%   BMI 31.42 kg/m  General: Well Developed, well nourished, and in no acute distress.   MSK: L-spine: Normal. Nontender spinal midline. Normal lumbar motion. Lower extremity strength is intact. Reflexes and sensation are intact distally.  Left knee: Normal-appearing nontender normal motion. Stable ligamentous exam.   Lab and Radiology Results  X-ray images L-spine obtained today personally and independently interpreted 6 lumbar vertebrae are present.  Some degenerative changes appear to be present at L6-S1 Await formal radiology review   Assessment and Plan: 27 y.o. male with left lateral calf pain and left lateral knee pain.  Etiology is unclear.  This been ongoing since before September 2023.  At that time he had a MRI of the knee that was largely unremarkable did not  explain the source of pain.  In the interim he has been completing a physician directed home exercise program.  He is still not better.  His symptoms today are most consistent with lumbar radiculopathy.  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  This congenital abnormality can put him at more risk for lumbar radiculopathy.  Plan for MRI lumbar spine to further evaluate his lateral leg pain. In the meantime short course of prednisone and trial of gabapentin at bedtime.  PDMP not reviewed this encounter. Orders Placed This Encounter  Procedures   DG Lumbar Spine 2-3 Views    Standing Status:   Future    Number of Occurrences:   1    Standing Expiration Date:   04/12/2022    Order Specific Question:   Reason for Exam (SYMPTOM  OR DIAGNOSIS REQUIRED)    Answer:   lumbar radiculities    Order Specific Question:   Preferred imaging location?    Answer:   [facility]   MR LUMBAR SPINE WO CONTRAST    Standing Status:   Future    Standing Expiration Date:   04/12/2022    Order Specific Question:   What is the patient's sedation requirement?    Answer:   No Sedation    Order Specific Question:   Does the patient  have a pacemaker or implanted devices?    Answer:   No    Order Specific Question:   Preferred imaging location?    Answer:   [facility] (table limit-350lbs)   Meds ordered this encounter  Medications   predniSONE (DELTASONE) 50 MG tablet    Sig: Take 1 tablet (50 mg total) by mouth daily.    Dispense:  5 tablet    Refill:  0   gabapentin (NEURONTIN) 300 MG capsule    Sig: Take 1 capsule (300 mg total) by mouth at bedtime as needed (leg pain).    Dispense:  90 capsule    Refill:  3     Discussed warning signs or symptoms. Please see discharge instructions. Patient expresses understanding.   The above documentation has been reviewed and is accurate  and complete [name], M.D.

## 2022-03-16 NOTE — Progress Notes (Signed)
Lumbar spine x-ray looks pretty normal to radiology.  An MRI will be helpful to further evaluate this.

## 2022-03-29 IMAGING — DX DG ABDOMEN 2V
2 series · 2 of 2 positions shown · non-contrast
Comparison: None.

CLINICAL DATA: Constipation.  Early satiety.

EXAM:
ABDOMEN - 2 VIEW

[abdomen erect]
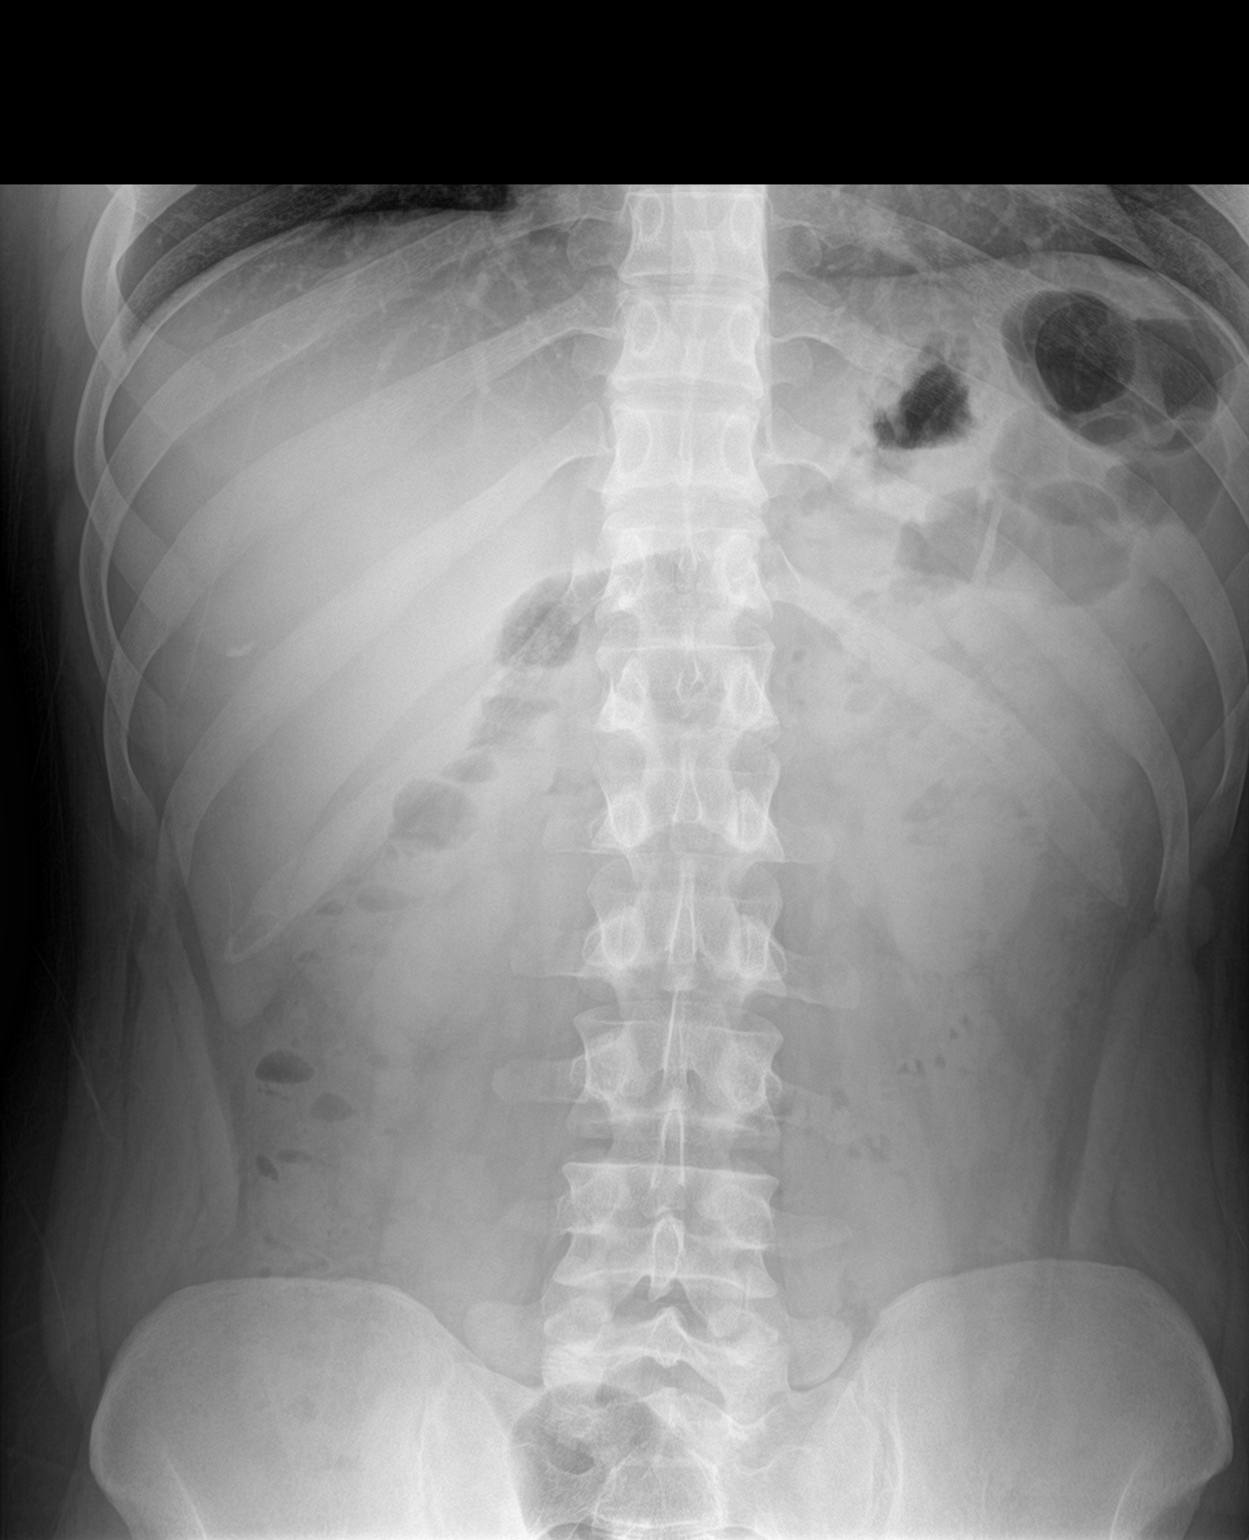

[abdomen supine]
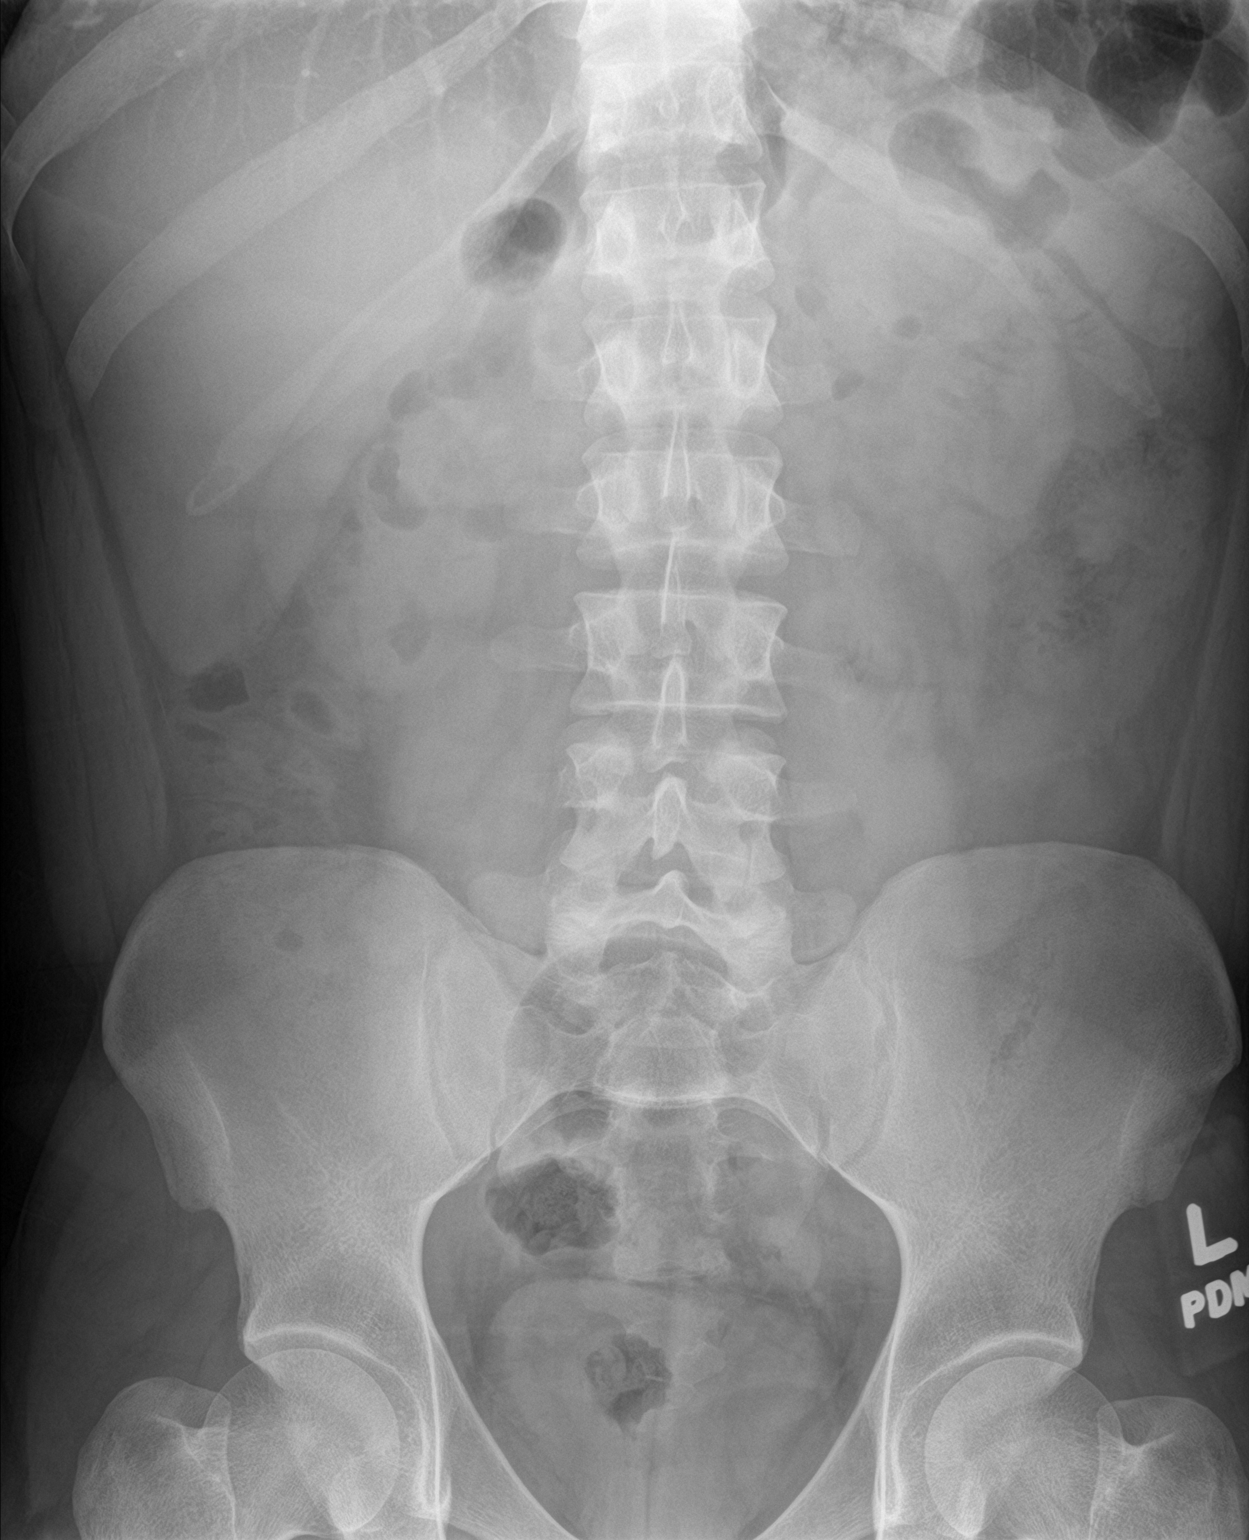

[2 of 2 positions shown; findings below may reference images not displayed]

FINDINGS: The bowel gas pattern is normal. There is no evidence of free air.
No radio-opaque calculi or other significant radiographic
abnormality is seen.
IMPRESSION: Negative.

## 2022-04-02 ENCOUNTER — Ambulatory Visit (INDEPENDENT_AMBULATORY_CARE_PROVIDER_SITE_OTHER): Payer: Commercial Managed Care - HMO

## 2022-04-02 DIAGNOSIS — M545 Low back pain, unspecified: Secondary | ICD-10-CM | POA: Diagnosis not present

## 2022-04-02 DIAGNOSIS — M5416 Radiculopathy, lumbar region: Secondary | ICD-10-CM

## 2022-04-04 NOTE — Progress Notes (Signed)
Your spine MRI does show an area where the nerves could be pinched.  Recommend return to clinic to go over the results in full detail and talk about treatment plan and options going forward.

## 2022-04-19 ENCOUNTER — Ambulatory Visit: Payer: Commercial Managed Care - HMO | Admitting: Family Medicine

## 2022-04-19 VITALS — BP 148/86 | HR 79 | Ht 65.0 in | Wt 183.0 lb

## 2022-04-19 DIAGNOSIS — M5416 Radiculopathy, lumbar region: Secondary | ICD-10-CM

## 2022-04-19 NOTE — Progress Notes (Signed)
Shirlyn Goltz, PhD, LAT, ATC acting as a scribe for Brad Leader, MD.  Brad [name] is a 27 y.o. male who presents to [place] at [facility] today for con'td left lateral calf pain and left lateral knee pain and L-spine MRI review. Pt was last seen by Dr. Georgina Snell on 03/14/22 and was prescribed prednisone, gabapentin, and advised to proceed to MRI. Today, pt reports L leg pain is feeling about the same.  Pt locates pain to around the tibial tuberosity and sometimes on the medial aspect.  Pain radiates from the back to the left leg to the anterior knee to the lower leg.  He notes back pain and left leg pain.  Dx imaging: 04/02/22 L-spine MRI 03/14/22 L-spine XR 10/02/21 L knee MRI             09/28/21 L knee XR 07/12/18 R knee XR  Pertinent review of systems: No fevers or chills  Relevant historical information: Abdominal pain history.   Exam:  BP (!) 148/86   Pulse 79   Ht 5\' 5"  (1.651 m)   Wt 183 lb (83 kg)   SpO2 99%   BMI 30.45 kg/m  General: Well Developed, well nourished, and in no acute distress.   MSK: L-spine nontender palpation spinal midline decreased lumbar motion.  Lower extremity strength is intact. Reflexes are intact.    Lab and Radiology Results  EXAM: MRI LUMBAR SPINE WITHOUT CONTRAST   TECHNIQUE: Multiplanar, multisequence MR imaging of the lumbar spine was performed. No intravenous contrast was administered.   COMPARISON:  Radiograph from 03/14/2022.   FINDINGS: Segmentation: Standard. Lowest well-formed disc space labeled the L5-S1 level.   Alignment: Physiologic with preservation of the normal lumbar lordosis. No listhesis.   Vertebrae: Vertebral body height well maintained without acute or chronic fracture. Bone marrow signal intensity within normal limits. No discrete or worrisome osseous lesions or abnormal marrow edema.   Conus medullaris and cauda equina: Conus extends to the T12 level. Conus and cauda equina  appear normal.   Paraspinal and other soft tissues: Unremarkable.   Disc levels:   L1-2:  Unremarkable.   L2-3:  Unremarkable.   L3-4:  Unremarkable.   L4-5: Normal interspace. Mild bilateral facet spurring. Resultant mild narrowing of the left lateral recess. Central canal remains patent. No significant foraminal stenosis.   L5-S1: Normal interspace. Mild bilateral facet spurring. No canal or foraminal stenosis.   IMPRESSION: 1. Mild bilateral facet spurring at L4-5 and L5-S1 with resultant mild left lateral recess narrowing at the L4-5 level. 2. Otherwise unremarkable MRI of the lumbar spine. No significant disc pathology or overt neural impingement.     Electronically Signed   By: Jeannine Boga M.D.   On: 04/02/2022 18:03   I, Brad [name], personally (independently) visualized and performed the interpretation of the images attached in this note.      Assessment and Plan: 27 y.o. male with left low back pain with lumbar radiculopathy left leg.  Pain thought to be L4 dermatomal pattern perhaps L5 on the left side.  His pain has been difficult to pin down historically.  He had a normal knee MRI in September.  The MRI today is abnormal enough that I do think it corresponds to his current pain.  Plan for trial epidural steroid injection.  If this does not work consider trial of physical therapy. Attempted to treat the lumbar radicular component with the epidural steroid injection today.  PDMP not reviewed this  encounter. Orders Placed This Encounter  Procedures   DG INJECT DIAG/THERA/INC NEEDLE/CATH/PLC EPI/LUMB/SAC W/IMG    Level and technique per radiology Lumbar EPI#1- [id number] exp 10/16/22/js//no aspirin/no otc/no allergies/no spinal stimulators/no special needs/PACS 04/02/2022    Standing Status:   Future    Standing Expiration Date:   05/20/2022    Order Specific Question:   Reason for Exam (SYMPTOM  OR DIAGNOSIS REQUIRED)    Answer:   Low back  pain    Order Specific Question:   Preferred Imaging Location?    Answer:   [id number]   No orders of the defined types were placed in this encounter.    Discussed warning signs or symptoms. Please see discharge instructions. Patient expresses understanding.   The above documentation has been reviewed and is accurate and complete Brad [name], M.D.

## 2022-04-19 NOTE — Patient Instructions (Addendum)
Thank you for coming in today.   Please call [facility] at [phone] to schedule your spine injection.    Let me know how it goes.      

## 2022-04-22 ENCOUNTER — Ambulatory Visit
Admission: RE | Admit: 2022-04-22 | Discharge: 2022-04-22 | Disposition: A | Discharge status: Home / Self Care | Payer: Commercial Managed Care - HMO | Source: Ambulatory Visit | Attending: Family Medicine | Admitting: Family Medicine

## 2022-04-22 DIAGNOSIS — M5416 Radiculopathy, lumbar region: Secondary | ICD-10-CM

## 2022-04-22 MED ORDER — IOPAMIDOL (ISOVUE-M 200) INJECTION 41%
1.0000 mL | Freq: Once | INTRAMUSCULAR | Status: AC
  Administered 2022-04-22: 1 mL via EPIDURAL

## 2022-04-22 MED ORDER — METHYLPREDNISOLONE ACETATE 40 MG/ML INJ SUSP (RADIOLOG
80.0000 mg | Freq: Once | INTRAMUSCULAR | Status: AC
  Administered 2022-04-22: 80 mg via EPIDURAL

## 2022-04-22 NOTE — Discharge Instructions (Signed)

## 2022-05-30 ENCOUNTER — Encounter: Payer: Self-pay | Admitting: Family Medicine

## 2022-12-12 IMAGING — DX DG HAND COMPLETE 3+V*R*
3 series · 3 of 3 positions shown · non-contrast
Comparison: None.

CLINICAL DATA: Blunt trauma to the hand with a hammer 3 weeks ago
with persistent pain, initial encounter

EXAM:
RIGHT HAND - COMPLETE 3+ VIEW

[hand pa]
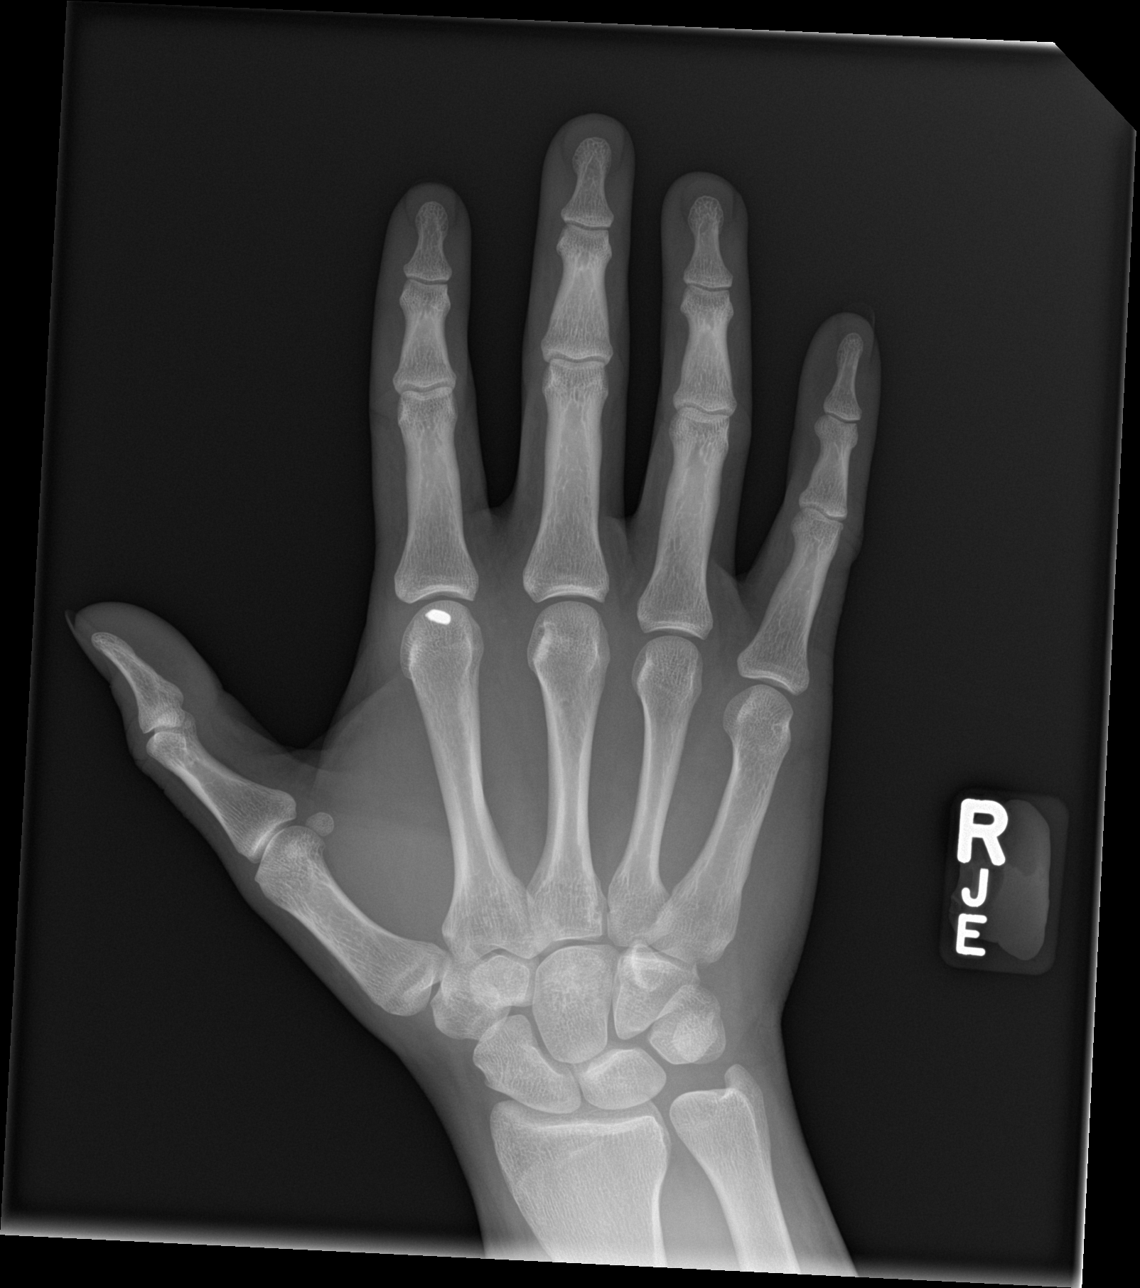

[hand obl]
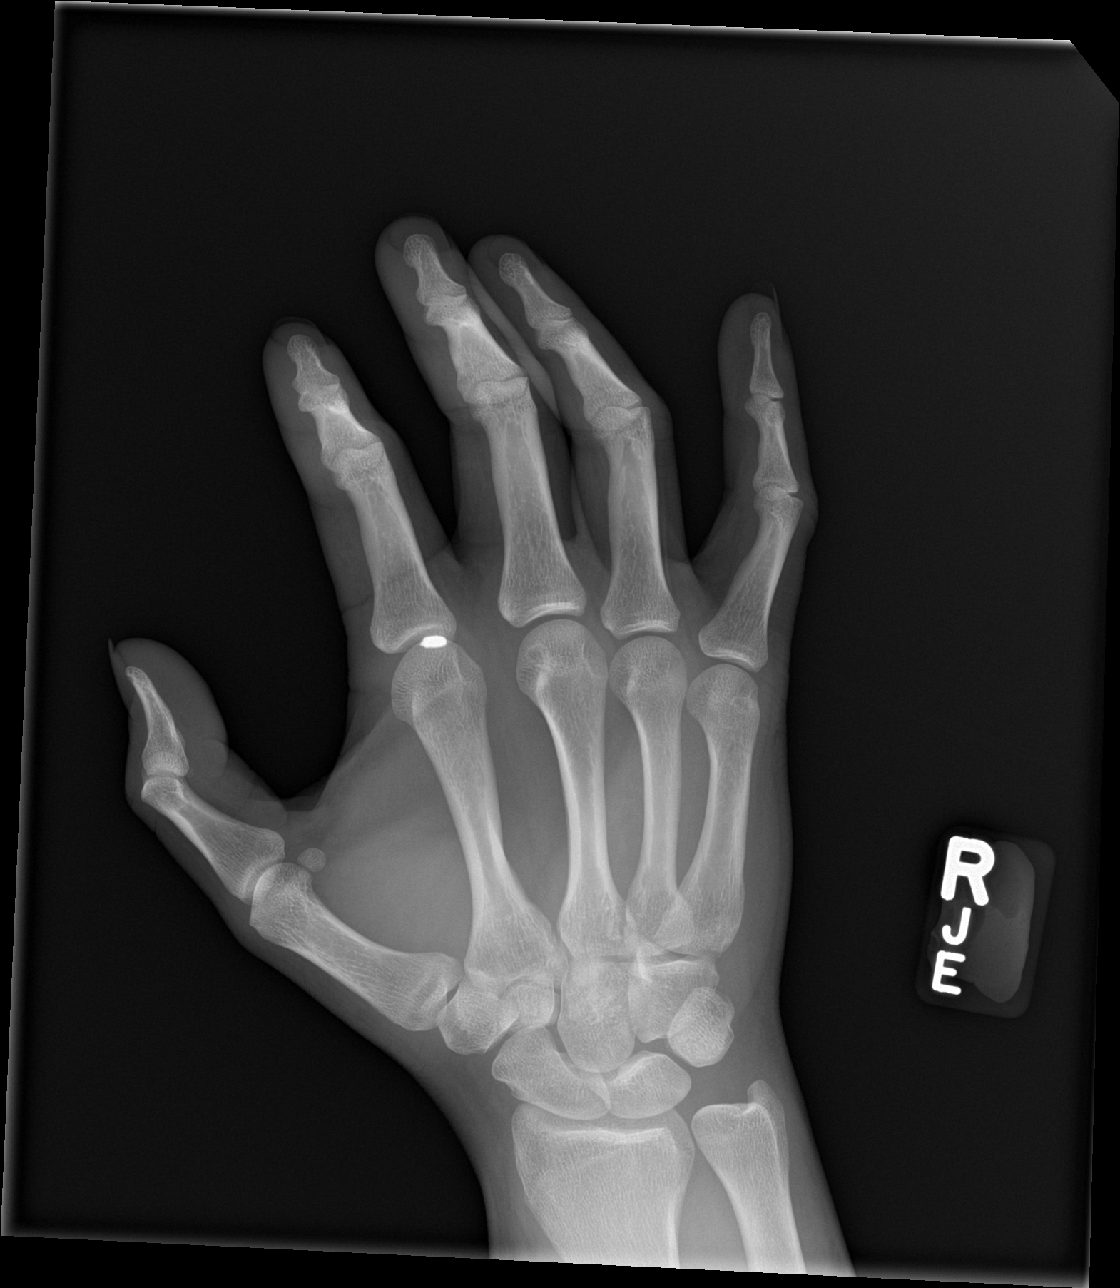

[hand lat]
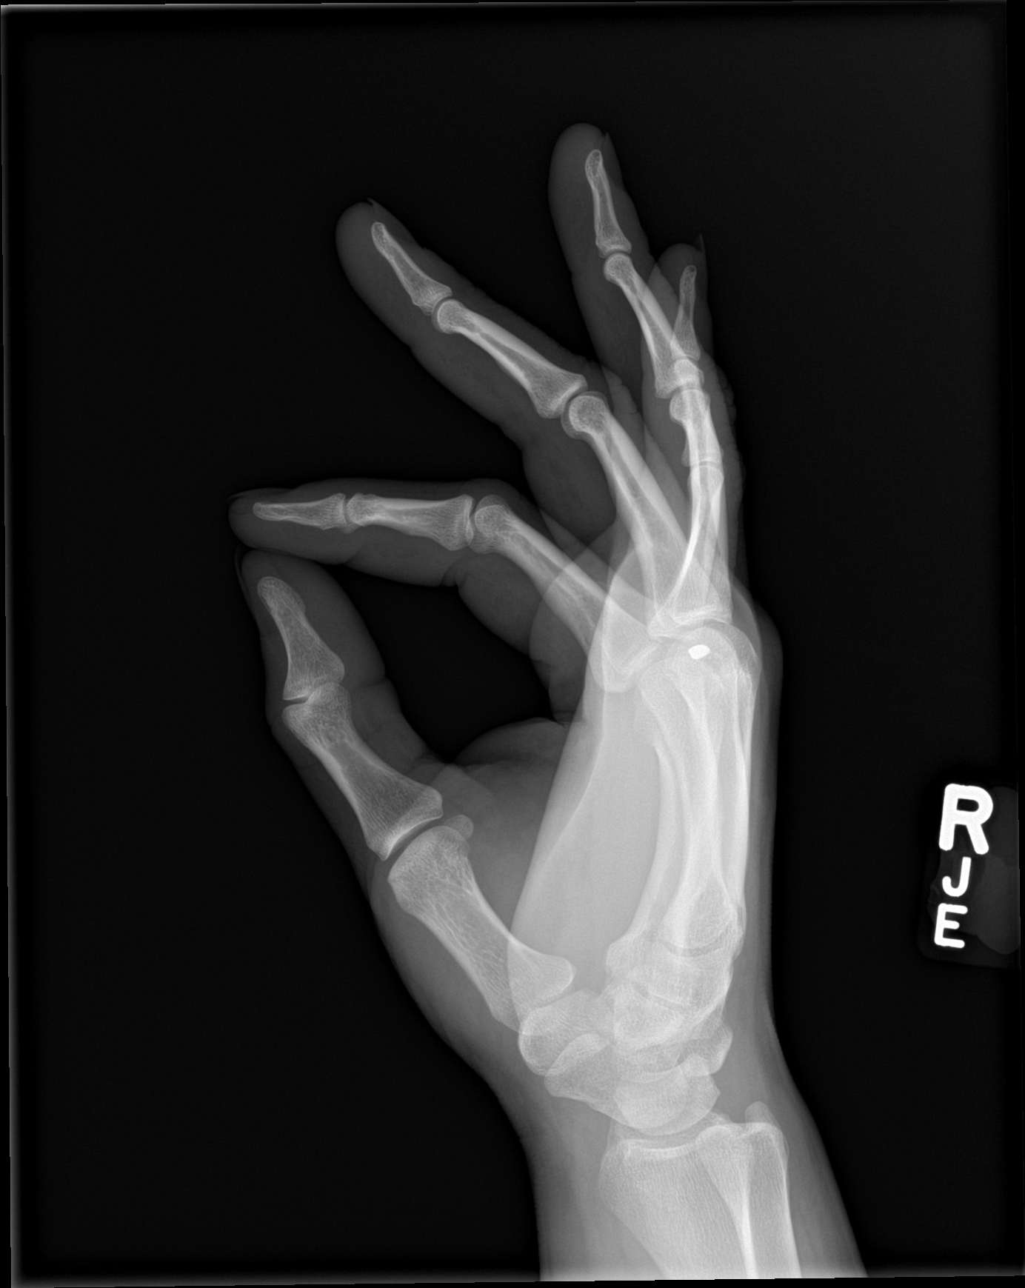

[3 of 3 positions shown; findings below may reference images not displayed]

FINDINGS: No acute fracture or dislocation is noted. Radiopaque foreign body
is noted adjacent to the posterior aspect of the second MCP joint
which may be related to the recent injury and possible retained nail
fragment. Correlation with the clinical history is recommended. No
other focal abnormality is noted.
IMPRESSION: No acute fracture seen.

Radiopaque foreign body adjacent to the posterior aspect of the
second MCP joint as described. Correlate with the clinical history.

## 2022-12-14 ENCOUNTER — Encounter: Payer: Self-pay | Admitting: Physician Assistant

## 2022-12-14 ENCOUNTER — Ambulatory Visit: Payer: Managed Care, Other (non HMO) | Admitting: Physician Assistant

## 2022-12-14 VITALS — BP 121/72 | HR 85 | Temp 98.1°F | Ht 65.0 in | Wt 178.1 lb

## 2022-12-14 DIAGNOSIS — J029 Acute pharyngitis, unspecified: Secondary | ICD-10-CM

## 2022-12-14 LAB — POCT RAPID STREP A (OFFICE): Rapid Strep A Screen: NEGATIVE

## 2022-12-14 NOTE — Progress Notes (Signed)
Established patient visit   Patient: Brad [name]   DOB: [birth year]   27 y.o. Male  MRN: [id number] Visit Date: 12/14/2022  Today's healthcare provider: Alfredia [name], PA-C   Chief Complaint  Patient presents with   Sore Throat    Symptoms- started this morning- scratchy, no cough. Does have some mucus- states no headache but a little sore in the chest area    Dizziness    Felt dizzy, almost "drunk". Has ate this morning and taking in dayquil. (Wife states maybe too much)   Subjective     Pt presents with wife today who translates when necessary.  He reports a sore throat that started when he woke up this morning, he did cough up a small amount of mucus this morning but denies a persistent cough since then.  Denies nasal congestion.  Reports some chronic tinnitus in the left ear.  He also reports a little bit of abdominal pain denies acid reflux or heartburn symptoms.  He reports he took some DayQuil, did not measure and after that felt dizzy which she describes as a "drunk feeling".  He has eaten since then.  Denies any sick contacts.  Medications: Outpatient Medications Prior to Visit  Medication Sig   [DISCONTINUED] gabapentin (NEURONTIN) 300 MG capsule Take 1 capsule (300 mg total) by mouth at bedtime as needed (leg pain).   No facility-administered medications prior to visit.    Review of Systems  Constitutional:  Negative for fatigue and fever.  HENT:  Positive for sore throat and tinnitus.   Respiratory:  Negative for cough and shortness of breath.   Cardiovascular:  Negative for chest pain, palpitations and leg swelling.  Gastrointestinal:  Positive for abdominal pain.  Neurological:  Positive for dizziness. Negative for headaches.       Objective    BP 121/72   Pulse 85   Temp 98.1 F (36.7 C) (Oral)   Ht 5\' 5"  (1.651 m)   Wt 178 lb 2 oz (80.8 kg)   SpO2 97%   BMI 29.64 kg/m    Physical Exam Constitutional:      General: He is awake.      Appearance: He is well-developed.  HENT:     Head: Normocephalic.     Right Ear: Tympanic membrane normal.     Left Ear: Tympanic membrane normal.     Mouth/Throat:     Pharynx: Posterior oropharyngeal erythema present. No oropharyngeal exudate.     Tonsils: No tonsillar exudate. 0 on the right. 0 on the left.  Eyes:     Conjunctiva/sclera: Conjunctivae normal.  Cardiovascular:     Rate and Rhythm: Normal rate and regular rhythm.     Heart sounds: Normal heart sounds.  Pulmonary:     Effort: Pulmonary effort is normal.     Breath sounds: Normal breath sounds.  Abdominal:     Palpations: Abdomen is soft.     Tenderness: There is no abdominal tenderness.  Skin:    General: Skin is warm.  Neurological:     Mental Status: He is alert and oriented to person, place, and time.  Psychiatric:        Attention and Perception: Attention normal.        Mood and Affect: Mood normal.        Speech: Speech normal.        Behavior: Behavior is cooperative.     Results for orders placed or performed in visit on 12/14/22  POCT rapid strep A  Result Value Ref Range   Rapid Strep A Screen Negative Negative    Assessment & Plan    Acute pharyngitis, unspecified etiology  Sore throat -     POCT rapid strep A   Offered covid test, pt declined. Poc strep negative  Advised rest, hydration, antihistamines otc. Cautioned with dayquil/other otc cough meds-- dose appropriately. If symptoms persist past 5-7 days please contact office.  Return if symptoms worsen or fail to improve.       Alfredia [name], PA-C  [facility] [phone] (phone) [phone] (fax)  [facility]

## 2023-05-29 ENCOUNTER — Ambulatory Visit (INDEPENDENT_AMBULATORY_CARE_PROVIDER_SITE_OTHER): Payer: Self-pay | Admitting: Family Medicine

## 2023-05-29 ENCOUNTER — Encounter: Payer: Self-pay | Admitting: Family Medicine

## 2023-05-29 VITALS — BP 134/82 | HR 96 | Temp 98.0°F | Resp 16 | Ht 65.0 in | Wt 170.0 lb

## 2023-05-29 DIAGNOSIS — J340 Abscess, furuncle and carbuncle of nose: Secondary | ICD-10-CM

## 2023-05-29 MED ORDER — SULFAMETHOXAZOLE-TRIMETHOPRIM 800-160 MG PO TABS: 1.0000 | ORAL_TABLET | Freq: Two times a day (BID) | ORAL | 0 refills | Status: AC

## 2023-05-29 NOTE — Progress Notes (Signed)
 No chief complaint on file.   Brad [name] is a 28 y.o. male here for a skin complaint.  Duration: 2 days Location: inside L nostril Pruritic? No Painful? Yes Drainage? No Trauma? No Fevers? No Other associated symptoms: causing his head to hurt Therapies tried thus far: none  Past Medical History:  Diagnosis Date   Depression    Headache    migraines   Hemorrhoids    No known health problems     BP 134/82 (BP Location: Left Arm, Cuff Size: Normal)   Pulse 96   Temp 98 F (36.7 C) (Oral)   Resp 16   Ht 5\' 5"  (1.651 m)   Wt 170 lb (77.1 kg)   SpO2 100%   BMI 28.29 kg/m  Gen: awake, alert, appearing stated age Lungs: No accessory muscle use Skin: Pinkish/red papule of the posterior nare on the left with surrounding erythema and edema.  There is TTP.  I do not appreciate any fluctuance, excoriation, or drainage. Psych: Age appropriate judgment and insight  Furuncle of nose - Plan: sulfamethoxazole-trimethoprim (BACTRIM DS) 800-160 MG tablet  7 d of Bactrim given localized swelling and pain. Ice, Tylenol , ibuprofen . Seek immediate care if worsening.  F/u prn. The patient voiced understanding and agreement to the plan.  Shellie Dials Ronceverte, DO 05/29/23 4:08 PM

## 2023-05-29 NOTE — Patient Instructions (Signed)
 OK to take Tylenol  1000 mg (2 extra strength tabs) or 975 mg (3 regular strength tabs) every 6 hours as needed.  Ice/cold pack over area for 10-15 min twice daily.  Ibuprofen  400-600 mg (2-3 over the counter strength tabs) every 6 hours as needed for pain.  Let us  know if you need anything.

## 2023-10-03 ENCOUNTER — Encounter: Payer: Self-pay | Admitting: Sports Medicine

## 2023-10-03 ENCOUNTER — Ambulatory Visit: Payer: Self-pay | Admitting: Family
